# Patient Record
Sex: Female | Born: 1995 | Hispanic: Yes | Marital: Married | Smoking: Never smoker
Health system: Southern US, Community
[De-identification: ages and names within clinical notes are randomized; demographics above are authoritative.]

## PROBLEM LIST (undated history)

## (undated) ENCOUNTER — Inpatient Hospital Stay (HOSPITAL_COMMUNITY): Payer: Self-pay

## (undated) DIAGNOSIS — J45909 Unspecified asthma, uncomplicated: Secondary | ICD-10-CM

## (undated) DIAGNOSIS — O039 Complete or unspecified spontaneous abortion without complication: Secondary | ICD-10-CM

## (undated) DIAGNOSIS — R000 Tachycardia, unspecified: Secondary | ICD-10-CM

## (undated) DIAGNOSIS — E119 Type 2 diabetes mellitus without complications: Secondary | ICD-10-CM

## (undated) DIAGNOSIS — Z789 Other specified health status: Secondary | ICD-10-CM

## (undated) HISTORY — PX: NO PAST SURGERIES: SHX2092

## (undated) HISTORY — PX: APPENDECTOMY: SHX54

---

## 2017-11-06 ENCOUNTER — Encounter (HOSPITAL_COMMUNITY): Payer: Self-pay

## 2017-11-06 ENCOUNTER — Emergency Department (HOSPITAL_COMMUNITY): Payer: No Typology Code available for payment source

## 2017-11-06 ENCOUNTER — Other Ambulatory Visit: Payer: Self-pay

## 2017-11-06 ENCOUNTER — Emergency Department (HOSPITAL_COMMUNITY)
Admission: EM | Admit: 2017-11-06 | Discharge: 2017-11-06 | Disposition: A | Discharge status: Home / Self Care | Payer: No Typology Code available for payment source | Attending: Emergency Medicine | Admitting: Emergency Medicine

## 2017-11-06 DIAGNOSIS — O26851 Spotting complicating pregnancy, first trimester: Secondary | ICD-10-CM | POA: Insufficient documentation

## 2017-11-06 DIAGNOSIS — O9989 Other specified diseases and conditions complicating pregnancy, childbirth and the puerperium: Secondary | ICD-10-CM | POA: Insufficient documentation

## 2017-11-06 DIAGNOSIS — R1030 Lower abdominal pain, unspecified: Secondary | ICD-10-CM | POA: Insufficient documentation

## 2017-11-06 DIAGNOSIS — Z3A01 Less than 8 weeks gestation of pregnancy: Secondary | ICD-10-CM | POA: Insufficient documentation

## 2017-11-06 DIAGNOSIS — B379 Candidiasis, unspecified: Secondary | ICD-10-CM

## 2017-11-06 DIAGNOSIS — B9689 Other specified bacterial agents as the cause of diseases classified elsewhere: Secondary | ICD-10-CM

## 2017-11-06 DIAGNOSIS — N760 Acute vaginitis: Secondary | ICD-10-CM

## 2017-11-06 LAB — CBC WITH DIFFERENTIAL/PLATELET
BASOS PCT: 0 %
Basophils Absolute: 0 10*3/uL (ref 0.0–0.1)
Eosinophils Absolute: 0.1 10*3/uL (ref 0.0–0.7)
Eosinophils Relative: 1 %
HEMATOCRIT: 39.3 % (ref 36.0–46.0)
Hemoglobin: 12.8 g/dL (ref 12.0–15.0)
LYMPHS PCT: 33 %
Lymphs Abs: 3.1 10*3/uL (ref 0.7–4.0)
MCH: 27.9 pg (ref 26.0–34.0)
MCHC: 32.6 g/dL (ref 30.0–36.0)
MCV: 85.8 fL (ref 78.0–100.0)
MONO ABS: 0.5 10*3/uL (ref 0.1–1.0)
Monocytes Relative: 5 %
NEUTROS ABS: 5.7 10*3/uL (ref 1.7–7.7)
Neutrophils Relative %: 61 %
Platelets: 340 10*3/uL (ref 150–400)
RBC: 4.58 MIL/uL (ref 3.87–5.11)
RDW: 13.7 % (ref 11.5–15.5)
WBC: 9.3 10*3/uL (ref 4.0–10.5)

## 2017-11-06 LAB — URINALYSIS, ROUTINE W REFLEX MICROSCOPIC
Bilirubin Urine: NEGATIVE
Glucose, UA: NEGATIVE mg/dL
Hgb urine dipstick: NEGATIVE
Ketones, ur: NEGATIVE mg/dL
LEUKOCYTES UA: NEGATIVE
Nitrite: NEGATIVE
PH: 6 (ref 5.0–8.0)
Protein, ur: NEGATIVE mg/dL
Specific Gravity, Urine: 1.024 (ref 1.005–1.030)

## 2017-11-06 LAB — BASIC METABOLIC PANEL
ANION GAP: 9 (ref 5–15)
BUN: 7 mg/dL (ref 6–20)
CO2: 23 mmol/L (ref 22–32)
Calcium: 9.3 mg/dL (ref 8.9–10.3)
Chloride: 107 mmol/L (ref 98–111)
Creatinine, Ser: 0.74 mg/dL (ref 0.44–1.00)
GFR calc Af Amer: >60 mL/min (ref >60)
GFR calc non Af Amer: >60 mL/min (ref >60)
Glucose, Bld: 121 mg/dL — ABNORMAL HIGH (ref 70–99)
Potassium: 3.6 mmol/L (ref 3.5–5.1)
Sodium: 139 mmol/L (ref 135–145)

## 2017-11-06 LAB — TYPE AND SCREEN
ABO/RH(D): A POS
Antibody Screen: NEGATIVE

## 2017-11-06 LAB — WET PREP, GENITAL
Sperm: NONE SEEN
Trich, Wet Prep: NONE SEEN

## 2017-11-06 LAB — HCG, QUANTITATIVE, PREGNANCY: HCG, BETA CHAIN, QUANT, S: 29310 m[IU]/mL — AB (ref <5)

## 2017-11-06 LAB — ABO/RH: ABO/RH(D): A POS

## 2017-11-06 LAB — I-STAT BETA HCG BLOOD, ED (MC, WL, AP ONLY): I-stat hCG, quantitative: >2000 m[IU]/mL — ABNORMAL HIGH (ref <5)

## 2017-11-06 MED ORDER — CLOTRIMAZOLE 1 % VA CREA: 1.0000 | TOPICAL_CREAM | Freq: Every day | VAGINAL | 0 refills | Status: DC

## 2017-11-06 MED ORDER — PRENATAL COMPLETE 14-0.4 MG PO TABS: 1.0000 | ORAL_TABLET | Freq: Every day | ORAL | 0 refills | Status: DC

## 2017-11-06 MED ORDER — METRONIDAZOLE 0.75 % VA GEL: 1.0000 | Freq: Two times a day (BID) | VAGINAL | 0 refills | Status: DC

## 2017-11-06 NOTE — ED Provider Notes (Signed)
Physical Exam  BP 118/78   Pulse 90   Temp 98.5 F (36.9 C) (Oral)   Resp 16   Ht 5\' 3"  (1.6 m)   Wt 52.6 kg (116 lb)   LMP 10/18/2017 (Exact Date)   SpO2 100%   BMI 20.55 kg/m   [facility]  [facility], PA-C at 4:29 PM. Briefly, the patient is a 22 y.o. female with PMHx of  has no past medical history on file. here with vaginal spotting and lower abdominal discomfort.  Patient reports that she is in the first trimester of her first pregnancy, estimating approximately 6 weeks.  Patient reports that lower abdominal discomfort is intermittent and sharp, not present when I evaluate her.  Patient otherwise asymptomatic, and reports she had one episode of nausea almost 1 week ago, but feels it was more related to having a headache.  Labs Reviewed  WET PREP, GENITAL - Abnormal; Notable for the following components:      Result Value   Yeast Wet Prep HPF POC MANY (*)    Clue Cells Wet Prep HPF POC PRESENT (*)    WBC, Wet Prep HPF POC FEW (*)    All other components within normal limits  BASIC METABOLIC PANEL - Abnormal; Notable for the following components:   Glucose, Bld 121 (*)    All other components within normal limits  I-STAT BETA HCG BLOOD, ED ([facility], [facility], [facility] ONLY) - Abnormal; Notable for the following components:   I-stat hCG, quantitative >2,000.0 (*)    All other components within normal limits  CBC WITH DIFFERENTIAL/PLATELET  URINALYSIS, ROUTINE W REFLEX MICROSCOPIC  ABO/RH  TYPE AND SCREEN  GC/CHLAMYDIA PROBE AMP ([place]) NOT AT [facility]    Course of Care:   Physical Exam  Constitutional: She appears well-developed and well-nourished. No distress.  Sitting comfortably in bed.  HENT:  Head: Normocephalic and atraumatic.  Eyes: Conjunctivae are normal. Right eye exhibits no discharge. Left eye exhibits no discharge.  EOMs normal to gross examination.  Neck: Normal range of motion.  Cardiovascular: Normal rate and regular rhythm.  Intact, 2+ radial pulse.   Pulmonary/Chest:  Normal respiratory effort. Patient converses comfortably. No audible wheeze or stridor.  Abdominal: Soft. She exhibits no distension. There is no tenderness. There is no guarding.  Benign abdomen with no guarding or rebound.  Musculoskeletal: Normal range of motion.  Neurological: She is alert.  Cranial nerves intact to gross observation. Patient moves extremities without difficulty.  Skin: Skin is warm and dry. She is not diaphoretic.  Psychiatric: She has a normal mood and affect. Her behavior is normal. Judgment and thought content normal.  Nursing note and vitals reviewed.   ED Course/Procedures   Clinical Course as of Nov 07 1819  Sat Nov 06, 2017  1818 Reassessed.  Benign abdomen.  Discussed results with patient including requiring follow-up at [facility] in 48 hours for repeat hCG followed by repeat ultrasound in 7 to 10 days.  Encouraged to return for any increase in bleeding or sudden increase in abdominal pain.   [AM]    Clinical Course User Index [AM] Elisha PonderMurray, Takeia Ciaravino B, PA-C    Procedures  MDM  Plan to d/c after US if normal with topical metrogel, clotrimazole, and prenatal vitamin.  Discharge plan is to have patient follow-up [facility] in 48 hours for recheck, given that she had no fetal activity ultrasound, which may be normal given the early pregnancy.  Radiologist also recommends repeat ultrasound in 7 to  10 days.  Patient is in full understanding.  Return precautions given.  Counseled patient on early pregnancy prenatal care and proper medication usage.    Elisha Ponder, PA-C 11/06/17 Lauretta Chester    Shaune Pollack, MD 11/06/17 2351

## 2017-11-06 NOTE — Discharge Instructions (Addendum)
Follow[facility] for [facility] evaluation and to establish OB/GYN there. You will [facility] to [facility] in 48 hours for repeat check of your pregnancy hormone, followed by a repeat ultrasound in 7 to 10 days provided everything is progressing normally.  Please return to the emergency department if you develop any sudden increase in vaginal bleeding or abdominal pain.  Please take your prenatal vitamin daily beginning tonight.  You will take the intravaginal medications, clotrimazole twice daily, and metronidazole, twice a day for the next 7 days.  These are to treat yeast infection and overgrowth of normal bacteria in the vagina.

## 2017-11-06 NOTE — ED Triage Notes (Signed)
She tells me she is ~[redacted] weeks gestation, and is here with c/o lower abd. "cramping and bleeding like I'm about to start my period". She is in no distress.

## 2017-11-06 NOTE — ED Provider Notes (Cosign Needed)
[facility]-EMERGENCY DEPT Provider Note   CSN: [id number] Arrival date & time: 11/06/17  [id number]     History   Chief Complaint Chief Complaint  Patient presents with  . Vaginal Bleeding    HPI Wanda Erickson is a 22 y.o. female who presents to ED for evaluation of lower abdominal cramping for the past week and spotting that began today.  She believes that she is approximately [redacted] weeks pregnant.  This is her first pregnancy.  She has not yet established care with an OB/GYN due to insurance issues, so an ultrasound has not been completed.  She reports one episode of nonbloody, nonbilious emesis earlier in the week but reports daily nausea and decreased appetite.  Reports several episodes of diarrhea for the past week.  She does report some yellow vaginal discharge before onset of spotting today.  She denies any fever, dysuria, hematuria, hematochezia or melena, chest pain, shortness of breath.  HPI  No past medical history on file.  There are no active problems to display for this patient.      OB History   None      Home Medications    Prior to Admission medications   Medication Sig Start Date End Date Taking? Authorizing Provider  clotrimazole (GYNE-LOTRIMIN) 1 % vaginal cream Place 1 Applicatorful vaginally at bedtime. 11/06/17   Destaney Sarkis, PA-C  metroNIDAZOLE (METROGEL VAGINAL) 0.75 % vaginal gel Place 1 Applicatorful vaginally 2 (two) times daily. 11/06/17   Christle Nolting, Hillary BowHina, PA-C  Prenatal Vit-Fe Fumarate-FA (PRENATAL COMPLETE) 14-0.4 MG TABS Take 1 tablet by mouth daily. 11/06/17   Dietrich PatesKhatri, Olanna Percifield, PA-C    Family History No family history on file.  Social History Social History   Tobacco Use  . Smoking status: Never Smoker  . Smokeless tobacco: Never Used  Substance Use Topics  . Alcohol use: Not on file  . Drug use: Not on file     Allergies   Septra [sulfamethoxazole-trimethoprim]   Review of Systems Review of Systems  Constitutional:  Negative for appetite change, chills and fever.  HENT: Negative for ear pain, rhinorrhea, sneezing and sore throat.   Eyes: Negative for photophobia and visual disturbance.  Respiratory: Negative for cough, chest tightness, shortness of breath and wheezing.   Cardiovascular: Negative for chest pain and palpitations.  Gastrointestinal: Positive for abdominal pain, diarrhea and nausea. Negative for blood in stool, constipation and vomiting.  Genitourinary: Positive for vaginal bleeding and vaginal discharge. Negative for dysuria, flank pain, hematuria and urgency.  Musculoskeletal: Negative for myalgias.  Skin: Negative for rash.  Neurological: Negative for dizziness, weakness and light-headedness.     Physical Exam Updated Vital Signs BP 118/78   Pulse 90   Temp 98.5 F (36.9 C) (Oral)   Resp 16   Ht 5\' 3"  (1.6 m)   Wt 52.6 kg (116 lb)   LMP 10/18/2017 (Exact Date)   SpO2 100%   BMI 20.55 kg/m   Physical Exam  Constitutional: She appears well-developed and well-nourished. No distress.  Anxious. NAD.  HENT:  Head: Normocephalic and atraumatic.  Nose: Nose normal.  Eyes: Conjunctivae and EOM are normal. Left eye exhibits no discharge. No scleral icterus.  Neck: Normal range of motion. Neck supple.  Cardiovascular: Normal rate, regular rhythm, normal heart sounds and intact distal pulses. Exam reveals no gallop and no friction rub.  No murmur heard. Pulmonary/Chest: Effort normal and breath sounds normal. No respiratory distress.  Abdominal: Soft. Bowel sounds are normal. She exhibits no distension.  There is no tenderness. There is no guarding.  Genitourinary: Cervix exhibits no motion tenderness. Right adnexum displays no tenderness. Left adnexum displays no tenderness. There is bleeding in the vagina.  Genitourinary Comments: Pelvic exam: normal external genitalia without evidence of trauma. VULVA: normal appearing vulva with no masses, tenderness or lesion. VAGINA: normal  appearing vagina with normal color and discharge, scant, dark red blood in vaginal vault; no lesions. CERVIX: normal appearing cervix without lesions, cervical motion tenderness absent, cervical os closed with out purulent discharge; No vaginal discharge. Wet prep and DNA probe for chlamydia and GC obtained.   ADNEXA: normal adnexa in size, nontender and no masses UTERUS: uterus is normal size, shape, consistency and nontender.    Musculoskeletal: Normal range of motion. She exhibits no edema.  Neurological: She is alert. She exhibits normal muscle tone. Coordination normal.  Skin: Skin is warm and dry. No rash noted.  Psychiatric: She has a normal mood and affect.  Nursing note and vitals reviewed.    ED Treatments / Results  Labs (all labs ordered are listed, but only abnormal results are displayed) Labs Reviewed  WET PREP, GENITAL - Abnormal; Notable for the following components:      Result Value   Yeast Wet Prep HPF POC MANY (*)    Clue Cells Wet Prep HPF POC PRESENT (*)    WBC, Wet Prep HPF POC FEW (*)    All other components within normal limits  BASIC METABOLIC PANEL - Abnormal; Notable for the following components:   Glucose, Bld 121 (*)    All other components within normal limits  I-STAT BETA HCG BLOOD, ED ([facility], [facility], [facility] ONLY) - Abnormal; Notable for the following components:   I-stat hCG, quantitative >2,000.0 (*)    All other components within normal limits  CBC WITH DIFFERENTIAL/PLATELET  URINALYSIS, ROUTINE W REFLEX MICROSCOPIC  HCG, QUANTITATIVE, PREGNANCY  ABO/RH  TYPE AND SCREEN  GC/CHLAMYDIA PROBE AMP ([place]) NOT AT [facility]    EKG None  Radiology No results found.  Procedures Procedures (including critical care time)  Medications Ordered in ED Medications - No data to display   Initial Impression / Assessment and Plan / ED Course  I have reviewed the triage vital signs and the nursing notes.  Pertinent labs & imaging results that were available  during my care of the patient were reviewed by me and considered in my medical decision making (see chart for details).     21 year old female presents to ED for evaluation of lower abdominal cramping for the past week, vaginal spotting that began today.  She believes that she is approximately [redacted] weeks pregnant.  This is her first pregnancy.  Had not yet established care with an OB/GYN due to insurance issues.  Reports one episode of nonbloody, nonbilious emesis earlier in the week but reports daily nausea and decreased appetite.  She reports some yellow vaginal discharge before onset of spotting.  Denies any fever, dysuria, chest pain or shortness of breath.  On physical exam she is overall well-appearing.  She does appear anxious.  There is no abdominal tenderness to palpation.  Pelvic exam revealed scant dark red blood in vaginal vault.  No cervical motion tenderness or adnexal tenderness.  Lab work significant for hCG greater than 2000.  CBC, BMP, urinalysis all unremarkable.  Wet prep positive for yeast and clue cells.  GC chlamydia probe pending.  Pelvic ultrasound pending.  Will treat with MetroGel and clotrimazole vaginal for BV and yeast infection.  Care handed off to oncoming provider, ADayton Scrape pending ultrasound. Discharge home if normal.  Portions of this note were generated with Dragon dictation software. Dictation errors may occur despite best attempts at proofreading.   Final Clinical Impressions(s) / ED Diagnoses   Final diagnoses:  Bacterial vaginosis  Yeast infection    ED Discharge Orders        Ordered    metroNIDAZOLE (METROGEL VAGINAL) 0.75 % vaginal gel  2 times daily     11/06/17 1643    clotrimazole (GYNE-LOTRIMIN) 1 % vaginal cream  Daily at bedtime     11/06/17 1643    Prenatal Vit-Fe Fumarate-FA (PRENATAL COMPLETE) 14-0.4 MG TABS  Daily     11/06/17 1643       Dietrich Pates, PA-C 11/06/17 1644

## 2017-11-08 ENCOUNTER — Inpatient Hospital Stay (HOSPITAL_COMMUNITY)
Admission: AD | Admit: 2017-11-08 | Discharge: 2017-11-08 | Disposition: A | Discharge status: Home / Self Care | Payer: No Typology Code available for payment source | Source: Ambulatory Visit | Attending: Obstetrics & Gynecology | Admitting: Obstetrics & Gynecology

## 2017-11-08 DIAGNOSIS — Z3490 Encounter for supervision of normal pregnancy, unspecified, unspecified trimester: Secondary | ICD-10-CM

## 2017-11-08 LAB — GC/CHLAMYDIA PROBE AMP (~~LOC~~) NOT AT ARMC
Chlamydia: NEGATIVE
Neisseria Gonorrhea: NEGATIVE

## 2017-11-08 NOTE — Discharge Instructions (Signed)
First Trimester of Pregnancy The first trimester of pregnancy is from week 1 until the end of week 13 (months 1 through 3). During this time, your baby will begin to develop inside you. At 6-8 weeks, the eyes and face are formed, and the heartbeat can be seen on ultrasound. At the end of 12 weeks, all the baby's organs are formed. Prenatal care is all the medical care you receive before the birth of your baby. Make sure you get good prenatal care and follow all of your doctor's instructions. Follow these instructions at home: Medicines  Take over-the-counter and prescription medicines only as told by your doctor. Some medicines are safe and some medicines are not safe during pregnancy.  Take a prenatal vitamin that contains at least 600 micrograms (mcg) of folic acid.  If you have trouble pooping (constipation), take medicine that will make your stool soft (stool softener) if your doctor approves. Eating and drinking  Eat regular, healthy meals.  Your doctor will tell you the amount of weight gain that is right for you.  Avoid raw meat and uncooked cheese.  If you feel sick to your stomach (nauseous) or throw up (vomit): ? Eat 4 or 5 small meals a day instead of 3 large meals. ? Try eating a few soda crackers. ? Drink liquids between meals instead of during meals.  To prevent constipation: ? Eat foods that are high in fiber, like fresh fruits and vegetables, whole grains, and beans. ? Drink enough fluids to keep your pee (urine) clear or pale yellow. Activity  Exercise only as told by your doctor. Stop exercising if you have cramps or pain in your lower belly (abdomen) or low back.  Do not exercise if it is too hot, too humid, or if you are in a place of great height (high altitude).  Try to avoid standing for long periods of time. Move your legs often if you must stand in one place for a long time.  Avoid heavy lifting.  Wear low-heeled shoes. Sit and stand up straight.  You  can have sex unless your doctor tells you not to. Relieving pain and discomfort  Wear a good support bra if your breasts are sore.  Take warm water baths (sitz baths) to soothe pain or discomfort caused by hemorrhoids. Use hemorrhoid cream if your doctor says it is okay.  Rest with your legs raised if you have leg cramps or low back pain.  If you have puffy, bulging veins (varicose veins) in your legs: ? Wear support hose or compression stockings as told by your doctor. ? Raise (elevate) your feet for 15 minutes, 3-4 times a day. ? Limit salt in your food. Prenatal care  Schedule your prenatal visits by the twelfth week of pregnancy.  Write down your questions. Take them to your prenatal visits.  Keep all your prenatal visits as told by your doctor. This is important. Safety  Wear your seat belt at all times when driving.  Make a list of emergency phone numbers. The list should include numbers for family, friends, the hospital, and police and fire departments. General instructions  Ask your doctor for a referral to a local prenatal class. Begin classes no later than at the start of month 6 of your pregnancy.  Ask for help if you need counseling or if you need help with nutrition. Your doctor can give you advice or tell you where to go for help.  Do not use hot tubs, steam rooms, or   saunas.  Do not douche or use tampons or scented sanitary pads.  Do not cross your legs for long periods of time.  Avoid all herbs and alcohol. Avoid drugs that are not approved by your doctor.  Do not use any tobacco products, including cigarettes, chewing tobacco, and electronic cigarettes. If you need help quitting, ask your doctor. You may get counseling or other support to help you quit.  Avoid cat litter boxes and soil used by cats. These carry germs that can cause birth defects in the baby and can cause a loss of your baby (miscarriage) or stillbirth.  Visit your dentist. At home, brush  your teeth with a soft toothbrush. Be gentle when you floss. Contact a doctor if:  You are dizzy.  You have mild cramps or pressure in your lower belly.  You have a nagging pain in your belly area.  You continue to feel sick to your stomach, you throw up, or you have watery poop (diarrhea).  You have a bad smelling fluid coming from your vagina.  You have pain when you pee (urinate).  You have increased puffiness (swelling) in your face, hands, legs, or ankles. Get help right away if:  You have a fever.  You are leaking fluid from your vagina.  You have spotting or bleeding from your vagina.  You have very bad belly cramping or pain.  You gain or lose weight rapidly.  You throw up blood. It may look like coffee grounds.  You are around people who have German measles, fifth disease, or chickenpox.  You have a very bad headache.  You have shortness of breath.  You have any kind of trauma, such as from a fall or a car accident. Summary  The first trimester of pregnancy is from week 1 until the end of week 13 (months 1 through 3).  To take care of yourself and your unborn baby, you will need to eat healthy meals, take medicines only if your doctor tells you to do so, and do activities that are safe for you and your baby.  Keep all follow-up visits as told by your doctor. This is important as your doctor will have to ensure that your baby is healthy and growing well. This information is not intended to replace advice given to you by your health care provider. Make sure you discuss any questions you have with your health care provider. Document Released: 09/09/2007 Document Revised: 03/31/2016 Document Reviewed: 03/31/2016 Elsevier Interactive Patient Education  2017 [employer]  

## 2017-11-08 NOTE — [facility] Provider Note (Signed)
[name] is a 22 y.o. female who [facility] for abdominal pain in early pregnancy. The patient was instructed to come to [facility] for a follow-up hCG today. The patient denies pain or bleeding now. US showed SIUP at [id number] without FHR noted. Quant hCG is not indicated at this time. Patient informed of new plan of care to follow-up with US in 1 week. US ordered. They will call her with an appointment. First trimester precautions discussed.   Marny LowensteinWenzel, Julie N, PA-C 11/08/2017 1:30 PM

## 2017-11-09 ENCOUNTER — Inpatient Hospital Stay (HOSPITAL_COMMUNITY): Payer: No Typology Code available for payment source

## 2017-11-09 ENCOUNTER — Other Ambulatory Visit: Payer: Self-pay

## 2017-11-09 ENCOUNTER — Inpatient Hospital Stay (HOSPITAL_COMMUNITY)
Admission: AD | Admit: 2017-11-09 | Discharge: 2017-11-09 | Disposition: A | Discharge status: Home / Self Care | Payer: No Typology Code available for payment source | Source: Ambulatory Visit | Attending: Obstetrics & Gynecology | Admitting: Obstetrics & Gynecology

## 2017-11-09 ENCOUNTER — Encounter (HOSPITAL_COMMUNITY): Payer: Self-pay

## 2017-11-09 DIAGNOSIS — O208 Other hemorrhage in early pregnancy: Secondary | ICD-10-CM | POA: Insufficient documentation

## 2017-11-09 DIAGNOSIS — O468X1 Other antepartum hemorrhage, first trimester: Secondary | ICD-10-CM

## 2017-11-09 DIAGNOSIS — O26891 Other specified pregnancy related conditions, first trimester: Secondary | ICD-10-CM | POA: Diagnosis present

## 2017-11-09 DIAGNOSIS — Z3A01 Less than 8 weeks gestation of pregnancy: Secondary | ICD-10-CM | POA: Diagnosis not present

## 2017-11-09 DIAGNOSIS — O418X10 Other specified disorders of amniotic fluid and membranes, first trimester, not applicable or unspecified: Secondary | ICD-10-CM

## 2017-11-09 DIAGNOSIS — O26851 Spotting complicating pregnancy, first trimester: Secondary | ICD-10-CM | POA: Diagnosis not present

## 2017-11-09 DIAGNOSIS — R109 Unspecified abdominal pain: Secondary | ICD-10-CM | POA: Diagnosis present

## 2017-11-09 DIAGNOSIS — O30031 Twin pregnancy, monochorionic/diamniotic, first trimester: Secondary | ICD-10-CM

## 2017-11-09 LAB — URINALYSIS, ROUTINE W REFLEX MICROSCOPIC
BILIRUBIN URINE: NEGATIVE
Glucose, UA: NEGATIVE mg/dL
Hgb urine dipstick: NEGATIVE
KETONES UR: NEGATIVE mg/dL
Leukocytes, UA: NEGATIVE
NITRITE: NEGATIVE
Protein, ur: NEGATIVE mg/dL
Specific Gravity, Urine: 1.031 — ABNORMAL HIGH (ref 1.005–1.030)
pH: 6 (ref 5.0–8.0)

## 2017-11-09 LAB — POCT PREGNANCY, URINE: PREG TEST UR: POSITIVE — AB

## 2017-11-09 NOTE — Discharge Instructions (Signed)
Multiple Pregnancy Having a multiple pregnancy means that a woman is carrying more than one baby at a time. She may be pregnant with twins, triplets, or more. The majority of multiple pregnancies are twins. Naturally conceiving triplets or more (higher-order multiples) is rare. Multiple pregnancies are riskier than single pregnancies. A woman with a multiple pregnancy is more likely to have certain problems during her pregnancy. Therefore, she will need to have more frequent appointments for prenatal care. How does a multiple pregnancy happen? A multiple pregnancy happens when:  The woman's body releases more than one egg at a time, and then each egg gets fertilized by a different sperm. ? This is the most common type of multiple pregnancy. ? Twins or other multiples produced this way are fraternal. They are no more alike than non-multiple siblings are.  One sperm fertilizes one egg, which then divides into more than one embryo. ? Twins or other multiples produced this way are identical. Identical multiples are always the same gender, and they look very much alike.  Who is most likely to have a multiple pregnancy? A multiple pregnancy is more likely to develop in women who:  Have had fertility treatment, especially if the treatment included fertility drugs.  Are older than 23 years of age.  Have already had four or more children.  Have a family history of multiple pregnancy.  How is a multiple pregnancy diagnosed? A multiple pregnancy may be diagnosed based on:  Symptoms such as: ? Rapid weight gain in the first 3 months of pregnancy (first trimester). ? More severe nausea and breast tenderness than what is typical of a single pregnancy. ? The uterus measuring larger than what is normal for the stage of the pregnancy.  Blood tests that detect a higher-than-normal level of human chorionic gonadotropin (hCG). This is a hormone that your body produces in early pregnancy.  Ultrasound  exam. This is used to confirm that you are carrying multiples.  What risks are associated with multiple pregnancy? A multiple pregnancy puts you at a higher risk for certain problems during or after your pregnancy, including:  Having your babies delivered before you have reached a full-term pregnancy (preterm birth). A full-term pregnancy lasts for at least 37 weeks. Babies born before 37 weeks may have a higher risk of a variety of health problems, such as breathing problems, feeding difficulties, cerebral palsy, and learning disabilities.  Diabetes.  Preeclampsia. This is a serious condition that causes high blood pressure along with other symptoms, such as swelling and headaches, during pregnancy.  Excessive blood loss after childbirth (postpartum hemorrhage).  Postpartum depression.  Low birth weight of the babies.  How will having a multiple pregnancy affect my care? Your health care provider will want to monitor you more closely during your pregnancy to make sure that your babies are growing normally and that you are healthy. Follow these instructions at home: Because your pregnancy is considered to be high risk, you will need to work closely with your health care team. You may also need to make some lifestyle changes. These may include the following: Eating and drinking  Increase your nutrition. ? Follow your health care provider's recommendations for weight gain. You may need to gain a little extra weight when you are pregnant with multiples. ? Eat healthy snacks often throughout the day. This can add calories and reduce nausea.  Drink enough fluid to keep your urine clear or pale yellow.  Take prenatal vitamins. Activity By 20-24 weeks, you may   need to limit your activities.  Avoid activities and work that take a lot of effort (are strenuous).  Ask your health care provider when you should stop having sexual intercourse.  Rest often.  General instructions  Do not use  any products that contain nicotine or tobacco, such as cigarettes and e-cigarettes. If you need help quitting, ask your health care provider.  Do not drink alcohol or use illegal drugs.  Take over-the-counter and prescription medicines only as told by your health care provider.  Arrange for extra help around the house.  Keep all follow-up visits and all prenatal visits as told by your health care provider. This is important. Contact a health care provider if:  You have dizziness.  You have persistent nausea, vomiting, or diarrhea.  You are having trouble gaining weight.  You have feelings of depression or other emotions that are interfering with your normal activities. Get help right away if:  You have a fever.  You have pain with urination.  You have fluid leaking from your vagina.  You have a bad-smelling vaginal discharge.  You notice increased swelling in your face, hands, legs, or ankles.  You have spotting or bleeding from your vagina.  You have pelvic cramps, pelvic pressure, or nagging pain in your abdomen or lower back.  You are having regular contractions.  You develop a severe headache, with or without visual changes.  You have shortness of breath or chest pain.  You notice less fetal movement, or no fetal movement. This information is not intended to replace advice given to you by your health care provider. Make sure you discuss any questions you have with your health care provider. Document Released: 12/31/2007 Document Revised: 11/22/2015 Document Reviewed: 11/22/2015 Elsevier Interactive Patient Education  2018 [employer]vier Inc.  Pelvic Rest Pelvic rest may be recommended if:  Your placenta is partially or completely covering the opening of your cervix (placenta previa).  There is bleeding between the wall of the uterus and the amniotic sac in the first trimester of pregnancy (subchorionic hemorrhage).  You went into labor too early (preterm  labor).  Based on your overall health and the health of your baby, your health care provider will decide if pelvic rest is right for you. How do I rest my pelvis? For as long as told by your health care provider:  Do not have sex, sexual stimulation, or an orgasm.  Do not use tampons. Do not douche. Do not put anything in your vagina.  Do not lift anything that is heavier than 10 lb (4.5 kg).  Avoid activities that take a lot of effort (are strenuous).  Avoid any activity in which your pelvic muscles could become strained.  When should I seek medical care? Seek medical care if you have:  Cramping pain in your lower abdomen.  Vaginal discharge.  A low, dull backache.  Regular contractions.  Uterine tightening.  When should I seek immediate medical care? Seek immediate medical care if:  You have vaginal bleeding and you are pregnant.  This information is not intended to replace advice given to you by your health care provider. Make sure you discuss any questions you have with your health care provider. Document Released: 07/18/2010 Document Revised: 08/29/2015 Document Reviewed: 09/24/2014 Elsevier Interactive Patient Education  2018 Elsevier Inc.  Subchorionic Hematoma A subchorionic hematoma is a gathering of blood between the outer wall of the placenta and the inner wall of the womb (uterus). The placenta is the organ that connects the  fetus to the wall of the uterus. The placenta performs the feeding, breathing (oxygen to the fetus), and waste removal (excretory work) of the fetus. Subchorionic hematoma is the most common abnormality found on a result from ultrasonography done during the first trimester or early second trimester of pregnancy. If there has been little or no vaginal bleeding, early small hematomas usually shrink on their own and do not affect your baby or pregnancy. The blood is gradually absorbed over 1-2 weeks. When bleeding starts later in pregnancy or the  hematoma is larger or occurs in an older pregnant woman, the outcome may not be as good. Larger hematomas may get bigger, which increases the chances for miscarriage. Subchorionic hematoma also increases the risk of premature detachment of the placenta from the uterus, preterm (premature) labor, and stillbirth. Follow these instructions at home:  Stay on bed rest if your health care provider recommends this. Although bed rest will not prevent more bleeding or prevent a miscarriage, your health care provider may recommend bed rest until you are advised otherwise.  Avoid heavy lifting (more than 10 lb [4.5 kg]), exercise, sexual intercourse, or douching as directed by your health care provider.  Keep track of the number of pads you use each day and how soaked (saturated) they are. Write down this information.  Do not use tampons.  Keep all follow-up appointments as directed by your health care provider. Your health care provider may ask you to have follow-up blood tests or ultrasound tests or both. Get help right away if:  You have severe cramps in your stomach, back, abdomen, or pelvis.  You have a fever.  You pass large clots or tissue. Save any tissue for your health care provider to look at.  Your bleeding increases or you become lightheaded, feel weak, or have fainting episodes. This information is not intended to replace advice given to you by your health care provider. Make sure you discuss any questions you have with your health care provider. Document Released: 07/08/2006 Document Revised: 08/29/2015 Document Reviewed: 10/20/2012 Elsevier Interactive Patient Education  2017 [employer]

## 2017-11-09 NOTE — MAU Provider Note (Signed)
History     CSN: [id number]  Arrival date and time: 11/09/17 1408   First Provider Initiated Contact with Patient 11/09/17 1454      Chief Complaint  Patient presents with  . Vaginal Bleeding  . Abdominal Pain   HPI Ms. Wanda Erickson is a 22 y.o. G1P0 at 7615w2d who presents to MAU today with complaint of spotting. [facility] with new onset spotting. She states that it stopped soon after that visit and returned today. She was also diagnosed with a yeast infection and BV at that time and is currently using clotrimazol and Metrogel for treatment of those infections. She states mild suprapubic discomfort. She has not used any pain medications. She denies fever, UTI symptoms, N/V/D but has had some constipation, last BM was 3 days ago.   OB History    Gravida  1   Para      Term      Preterm      AB      Living        SAB      TAB      Ectopic      Multiple      Live Births              Past Medical History:  Diagnosis Date  . Medical history non-contributory     Past Surgical History:  Procedure Laterality Date  . NO PAST SURGERIES      History reviewed. No pertinent family history.  Social History   Tobacco Use  . Smoking status: Never Smoker  . Smokeless tobacco: Never Used  Substance Use Topics  . Alcohol use: Not Currently  . Drug use: Never    Allergies:  Allergies  Allergen Reactions  . Septra [Sulfamethoxazole-Trimethoprim]     No medications prior to admission.    Review of Systems  Constitutional: Negative for fever.  Gastrointestinal: Positive for abdominal pain and constipation. Negative for diarrhea, nausea and vomiting.  Genitourinary: Positive for vaginal bleeding. Negative for dysuria, frequency, urgency and vaginal discharge.   Physical Exam   Blood pressure 134/68, pulse 95, temperature 98.8 F (37.1 C), temperature source Oral, resp. rate 18, height 5\' 3"  (1.6 m), weight 163 lb (73.9 kg),  last menstrual period 09/19/2017, SpO2 100 %.  Physical Exam  Nursing note and vitals reviewed. Constitutional: She is oriented to person, place, and time. She appears well-developed and well-nourished. No distress.  HENT:  Head: Normocephalic and atraumatic.  Cardiovascular: Normal rate.  Respiratory: Effort normal.  GI: Soft. She exhibits no distension and no mass. There is no tenderness. There is no rebound and no guarding.  Neurological: She is alert and oriented to person, place, and time.  Skin: Skin is warm and dry. No erythema.  Psychiatric: She has a normal mood and affect.     Results for orders placed or performed during the hospital encounter of 11/09/17 (from the past 24 hour(s))  Urinalysis, Routine w reflex microscopic     Status: Abnormal   Collection Time: 11/09/17  2:36 PM  Result Value Ref Range   Color, Urine AMBER (A) YELLOW   APPearance CLOUDY (A) CLEAR   Specific Gravity, Urine 1.031 (H) 1.005 - 1.030   pH 6.0 5.0 - 8.0   Glucose, UA NEGATIVE NEGATIVE mg/dL   Hgb urine dipstick NEGATIVE NEGATIVE   Bilirubin Urine NEGATIVE NEGATIVE   Ketones, ur NEGATIVE NEGATIVE mg/dL   Protein, ur NEGATIVE NEGATIVE mg/dL  Nitrite NEGATIVE NEGATIVE   Leukocytes, UA NEGATIVE NEGATIVE  Pregnancy, urine POC     Status: Abnormal   Collection Time: 11/09/17  2:39 PM  Result Value Ref Range   Preg Test, Ur POSITIVE (A) NEGATIVE   US Ob Comp < 14 Wks  Result Date: 11/06/2017 CLINICAL DATA:  Pregnant, spotting EXAM: OBSTETRIC <14 WK [place] AND TRANSVAGINAL OB US TECHNIQUE: Both transabdominal and transvaginal ultrasound examinations were performed for complete evaluation of the gestation as well as the maternal uterus, adnexal regions, and pelvic cul-de-sac. Transvaginal technique was performed to assess early pregnancy. COMPARISON:  None. FINDINGS: Intrauterine gestational sac: Single Yolk sac:  Visualized. Embryo:  Visualized. Cardiac Activity: Not Visualized. CRL:  3.4 mm   6 w   0  d                  [place] EDC: 07/02/2018 Subchorionic hemorrhage:  None visualized. Maternal uterus/adnexae: Bilateral ovaries are within normal limits, noting a 2.4 cm simple right paraovarian cyst, benign. No free fluid. IMPRESSION: Single intrauterine gestational sac with yolk sac and small fetal pole, measuring 6 weeks 0 days by crown-rump length. No cardiac activity is visualized, although this may simply be due to technical limitations and early gestation. Serial beta HCG is suggested, supplemented by short-term follow-up pelvic ultrasound in 7-10 days as clinically warranted. Electronically Signed   By: Charline Bills M.D.   On: 11/06/2017 17:15   US Ob Transvaginal  Result Date: 11/09/2017 CLINICAL DATA:  Bleeding for 4 days. Quantitative beta HCG on 11/06/2017 was 11914. LMP was 09/19/2017. EDC by LMP is 06/26/2018. Gestational age by LMP is 7 weeks 2 days. EXAM: TRANSVAGINAL OB ULTRASOUND TECHNIQUE: Transvaginal ultrasound was performed for complete evaluation of the gestation as well as the maternal uterus, adnexal regions, and pelvic cul-de-sac. COMPARISON:  None. FINDINGS: Intrauterine gestational sac: Single Yolk sac:  2 yolk sacs are identified. Embryo:  Not Visualized. Cardiac Activity: Not Visualized. Heart Rate: Absent bpm MSD: 15.5 mm   6 w   2 d Subchorionic hemorrhage: Small subchorionic hemorrhage identified. The ovaries are normal in appearance. RIGHT corpus luteum cyst is present. Maternal uterus/adnexae: A para ovarian cyst is identified adjacent to the RIGHT ovary. Question of arcuate versus septate uterus. No free pelvic fluid. IMPRESSION: 1. Single intrauterine gestational sac. 2. There are 2 yolk sacs, consistent with diamniotic monochorionic twin gestation. 3. Given the lack of detectable embryos at this time, follow-up ultrasound is recommended in 14 days to assess dating. Electronically Signed   By: Norva Pavlov M.D.   On: 11/09/2017 16:20   US Ob Transvaginal  Result Date:  11/06/2017 CLINICAL DATA:  Pregnant, spotting EXAM: OBSTETRIC <14 WK [place] AND TRANSVAGINAL OB US TECHNIQUE: Both transabdominal and transvaginal ultrasound examinations were performed for complete evaluation of the gestation as well as the maternal uterus, adnexal regions, and pelvic cul-de-sac. Transvaginal technique was performed to assess early pregnancy. COMPARISON:  None. FINDINGS: Intrauterine gestational sac: Single Yolk sac:  Visualized. Embryo:  Visualized. Cardiac Activity: Not Visualized. CRL:  3.4 mm   6 w   0 d                  [place] EDC: 07/02/2018 Subchorionic hemorrhage:  None visualized. Maternal uterus/adnexae: Bilateral ovaries are within normal limits, noting a 2.4 cm simple right paraovarian cyst, benign. No free fluid. IMPRESSION: Single intrauterine gestational sac with yolk sac and small fetal pole, measuring 6 weeks 0 days by crown-rump length. No cardiac activity  is visualized, although this may simply be due to technical limitations and early gestation. Serial beta HCG is suggested, supplemented by short-term follow-up pelvic ultrasound in 7-10 days as clinically warranted. Electronically Signed   By: Charline Bills M.D.   On: 11/06/2017 17:15    MAU Course  Procedures Pt informed that the ultrasound is considered a limited OB ultrasound and is not intended to be a complete ultrasound exam.  Patient also informed that the ultrasound is not being completed with the intent of assessing for fetal or placental anomalies or any pelvic abnormalities.  Explained that the purpose of today's ultrasound is to assess for  viability.  Patient acknowledges the purpose of the exam and the limitations of the study. Gestation too small to visualize.   MDM Reviewed visit from [facility] on 11/06/17 including labs and [place].  US performed at bedside.  SIUP to small to visualize well. Will order formal US to confirm viablity.  US OB TV ordered - consistent with early gestation of mono-di twins. No embryo or FHR  seen.   Assessment and Plan  A: Twin gestation  Small subchorionic hemorrhage   P: Discharge home Complete previously prescribed clotrimazol and metrogel Advised to start taking an additional 1 mg Folic Acid with PNV First precautions and pelvic rest discussed Outpatient [place] ordered to confirm viability in ~ 10 days. They will call patient with an appointment.  Patient referred to [facility] for high risk OB prenatal care. In-basket message sent to schedule. They will call patient with an appointment  Patient may return to MAU as needed or if her condition were to change or worsen  Vonzella Nipple, PA-C 11/09/2017, 4:43 PM

## 2017-11-09 NOTE — MAU Note (Signed)
Pt. Had pos. hpt 4 weeks ago.  Pt reports brown bleeding on Sat and [facility]  Pt. Reports bleeding stopped but started again today.  Pt. Reports bright red spotting today.  Last intercourse was 2 months ago

## 2017-11-16 ENCOUNTER — Other Ambulatory Visit: Payer: Self-pay

## 2017-11-16 ENCOUNTER — Encounter (HOSPITAL_COMMUNITY): Payer: Self-pay

## 2017-11-16 ENCOUNTER — Inpatient Hospital Stay (HOSPITAL_COMMUNITY)
Admission: AD | Admit: 2017-11-16 | Discharge: 2017-11-16 | Disposition: A | Discharge status: Home / Self Care | Payer: No Typology Code available for payment source | Source: Ambulatory Visit | Attending: Obstetrics and Gynecology | Admitting: Obstetrics and Gynecology

## 2017-11-16 DIAGNOSIS — O209 Hemorrhage in early pregnancy, unspecified: Secondary | ICD-10-CM | POA: Diagnosis not present

## 2017-11-16 DIAGNOSIS — Z3A08 8 weeks gestation of pregnancy: Secondary | ICD-10-CM | POA: Insufficient documentation

## 2017-11-16 DIAGNOSIS — O30011 Twin pregnancy, monochorionic/monoamniotic, first trimester: Secondary | ICD-10-CM | POA: Insufficient documentation

## 2017-11-16 DIAGNOSIS — N939 Abnormal uterine and vaginal bleeding, unspecified: Secondary | ICD-10-CM | POA: Diagnosis present

## 2017-11-16 DIAGNOSIS — Z882 Allergy status to sulfonamides status: Secondary | ICD-10-CM | POA: Diagnosis not present

## 2017-11-16 LAB — URINALYSIS, ROUTINE W REFLEX MICROSCOPIC
Bacteria, UA: NONE SEEN
Bilirubin Urine: NEGATIVE
GLUCOSE, UA: NEGATIVE mg/dL
KETONES UR: NEGATIVE mg/dL
Leukocytes, UA: NEGATIVE
Nitrite: NEGATIVE
Protein, ur: NEGATIVE mg/dL
Specific Gravity, Urine: 1.018 (ref 1.005–1.030)
pH: 8 (ref 5.0–8.0)

## 2017-11-16 NOTE — MAU Provider Note (Signed)
[facility] Provider Note   CSN: [id number] Arrival date & time: 11/16/17  1122     History   Chief Complaint Chief Complaint  Patient presents with  . Vaginal Bleeding    HPI Wanda Erickson is a 22 y.o. G1P0 @ 4554w2d gestation by LMP presents to the MAU with vaginal bleeding. [facility] and in the MAU 11/09/17. Patient had ultrasound that showed early gestation of mono-di twins. No embryo or FHR seen at that time. Patient has f/u ultrasound scheduled. Patient reports that yesterday she went for a scheduled free [facility] and they told her she was having a miscarriage because they could not see a heart beat. Patent reports that it scared her and that is why she came today.   HPI  Past Medical History:  Diagnosis Date  . Medical history non-contributory     There are no active problems to display for this patient.   Past Surgical History:  Procedure Laterality Date  . NO PAST SURGERIES       OB History    Gravida  1   Para      Term      Preterm      AB      Living        SAB      TAB      Ectopic      Multiple      Live Births               Home Medications    Prior to Admission medications   Medication Sig Start Date End Date Taking? Authorizing Provider  clotrimazole (GYNE-LOTRIMIN) 1 % vaginal cream Place 1 Applicatorful vaginally at bedtime. 11/06/17   Khatri, Hina, PA-C  metroNIDAZOLE (METROGEL VAGINAL) 0.75 % vaginal gel Place 1 Applicatorful vaginally 2 (two) times daily. 11/06/17   Khatri, Hillary BowHina, PA-C  Prenatal Vit-Fe Fumarate-FA (PRENATAL COMPLETE) 14-0.4 MG TABS Take 1 tablet by mouth daily. 11/06/17   Dietrich PatesKhatri, Hina, PA-C    Family History History reviewed. No pertinent family history.  Social History Social History   Tobacco Use  . Smoking status: Never Smoker  . Smokeless tobacco: Never Used  Substance Use Topics  . Alcohol use:  Not Currently  . Drug use: Never     Allergies   Septra [sulfamethoxazole-trimethoprim]   Review of Systems Review of Systems  Genitourinary: Positive for vaginal bleeding.       Early pregnant  All other systems reviewed and are negative.    Physical Exam Updated Vital Signs BP 130/76 (BP Location: Right Arm)   Pulse (!) 117   Temp 98.3 F (36.8 C) (Oral)   Resp 18   LMP 09/19/2017 (Exact Date)   Physical Exam  Constitutional: She appears well-developed and well-nourished. No distress.  HENT:  Head: Normocephalic.  Eyes: EOM are normal.  Neck: Neck supple.  Cardiovascular: Normal rate.  Pulmonary/Chest: Effort normal.  Abdominal: Soft. There is no tenderness.  Genitourinary:  Genitourinary Comments: External genitalia without lesions, scant blood vaginal vault, cervix long, closed, no CMT, no adnexal tenderness or mass palpated, uterus slightly enlarged.   Musculoskeletal: Normal range of motion.  Neurological: She is alert.  Skin: Skin is warm and dry.  Psychiatric: She has a normal mood and affect. Her behavior is normal.  Nursing note and vitals reviewed.    ED Treatments / Results  Labs (all labs ordered are  listed, but only abnormal results are displayed) Labs Reviewed  URINALYSIS, ROUTINE W REFLEX MICROSCOPIC    Radiology No results found.  Procedures Procedures (including critical care time)  Medications Ordered in ED Medications - No data to display   Initial Impression / Assessment and Plan / ED Course  I have reviewed the triage vital signs and the nursing notes.  22 y.o. G1P0 with bleeding in early pregnancy stable for d/c with normal vital signs and minimal bleeding. Patient to keep her scheduled appointment for f/u ultrasound. Return precautions discussed.  Final Clinical Impressions(s) / ED Diagnoses   Final diagnoses:  Vaginal bleeding in pregnancy, first trimester    ED Discharge Orders    None

## 2017-11-16 NOTE — MAU Note (Signed)
Pt presents to MAU with c/o vaginal bleeding that started over two weeks ago, but has increased in the last 2 days. She has had bright red bleeding with pea-sized clots and abdominal cramping.

## 2017-11-16 NOTE — Discharge Instructions (Signed)
Vaginal Bleeding During Pregnancy, First Trimester °A small amount of bleeding (spotting) from the vagina is common in early pregnancy. Sometimes the bleeding is normal and is not a problem, and sometimes it is a sign of something serious. Be sure to tell your doctor about any bleeding from your vagina right away. °Follow these instructions at home: °· Watch your condition for any changes. °· Follow your doctor's instructions about how active you can be. °· If you are on bed rest: °? You may need to stay in bed and only get up to use the bathroom. °? You may be allowed to do some activities. °? If you need help, make plans for someone to help you. °· Write down: °? The number of pads you use each day. °? How often you change pads. °? How soaked (saturated) your pads are. °· Do not use tampons. °· Do not douche. °· Do not have sex or orgasms until your doctor says it is okay. °· If you pass any tissue from your vagina, save the tissue so you can show it to your doctor. °· Only take medicines as told by your doctor. °· Do not take aspirin because it can make you bleed. °· Keep all follow-up visits as told by your doctor. °Contact a doctor if: °· You bleed from your vagina. °· You have cramps. °· You have labor pains. °· You have a fever that does not go away after you take medicine. °Get help right away if: °· You have very bad cramps in your back or belly (abdomen). °· You pass large clots or tissue from your vagina. °· You bleed more. °· You feel light-headed or weak. °· You pass out (faint). °· You have chills. °· You are leaking fluid or have a gush of fluid from your vagina. °· You pass out while pooping (having a bowel movement). °This information is not intended to replace advice given to you by your health care provider. Make sure you discuss any questions you have with your health care provider. °Document Released: 08/07/2013 Document Revised: 08/29/2015 Document Reviewed: 11/28/2012 °Elsevier Interactive  Patient Education © 2018 [employer] ° °

## 2017-11-18 ENCOUNTER — Ambulatory Visit (HOSPITAL_COMMUNITY)
Admit: 2017-11-18 | Discharge: 2017-11-18 | Disposition: A | Discharge status: Home / Self Care | Payer: No Typology Code available for payment source | Attending: Medical | Admitting: Medical

## 2017-11-18 ENCOUNTER — Telehealth: Payer: Self-pay | Admitting: Medical

## 2017-11-18 DIAGNOSIS — O209 Hemorrhage in early pregnancy, unspecified: Secondary | ICD-10-CM | POA: Diagnosis not present

## 2017-11-18 DIAGNOSIS — O418X10 Other specified disorders of amniotic fluid and membranes, first trimester, not applicable or unspecified: Secondary | ICD-10-CM

## 2017-11-18 DIAGNOSIS — O30009 Twin pregnancy, unspecified number of placenta and unspecified number of amniotic sacs, unspecified trimester: Secondary | ICD-10-CM

## 2017-11-18 DIAGNOSIS — O30031 Twin pregnancy, monochorionic/diamniotic, first trimester: Secondary | ICD-10-CM

## 2017-11-18 DIAGNOSIS — O468X1 Other antepartum hemorrhage, first trimester: Secondary | ICD-10-CM

## 2017-11-18 NOTE — Telephone Encounter (Signed)
Discussed US results with patient. Further development noted of mono-mono twins but without FHR noted at this time. Not definitive for failed pregnancy, will re-scan in 1 week for viability as [facility]. Order placed. They will call with appointment time. Patient to follow-up with [facility] for results following US.   Marny LowensteinWenzel, Julie N, PA-C 11/18/2017 2:59 PM

## 2017-11-22 ENCOUNTER — Observation Stay
Admission: EM | Admit: 2017-11-22 | Discharge: 2017-11-23 | Disposition: A | Discharge status: Home / Self Care | Payer: No Typology Code available for payment source | Attending: Obstetrics and Gynecology | Admitting: Obstetrics and Gynecology

## 2017-11-22 ENCOUNTER — Emergency Department: Payer: No Typology Code available for payment source

## 2017-11-22 ENCOUNTER — Other Ambulatory Visit: Payer: Self-pay

## 2017-11-22 ENCOUNTER — Encounter: Payer: Self-pay | Admitting: Emergency Medicine

## 2017-11-22 DIAGNOSIS — Z882 Allergy status to sulfonamides status: Secondary | ICD-10-CM | POA: Insufficient documentation

## 2017-11-22 DIAGNOSIS — N939 Abnormal uterine and vaginal bleeding, unspecified: Secondary | ICD-10-CM | POA: Diagnosis present

## 2017-11-22 DIAGNOSIS — D62 Acute posthemorrhagic anemia: Secondary | ICD-10-CM | POA: Diagnosis not present

## 2017-11-22 DIAGNOSIS — O031 Delayed or excessive hemorrhage following incomplete spontaneous abortion: Principal | ICD-10-CM | POA: Diagnosis present

## 2017-11-22 DIAGNOSIS — O034 Incomplete spontaneous abortion without complication: Secondary | ICD-10-CM | POA: Diagnosis present

## 2017-11-22 HISTORY — DX: Other specified health status: Z78.9

## 2017-11-22 LAB — COMPREHENSIVE METABOLIC PANEL
ALT: 16 U/L (ref 0–44)
AST: 20 U/L (ref 15–41)
Albumin: 4.3 g/dL (ref 3.5–5.0)
Alkaline Phosphatase: 68 U/L (ref 38–126)
Anion gap: 9 (ref 5–15)
BILIRUBIN TOTAL: 0.4 mg/dL (ref 0.3–1.2)
BUN: 12 mg/dL (ref 6–20)
CO2: 22 mmol/L (ref 22–32)
CREATININE: 0.86 mg/dL (ref 0.44–1.00)
Calcium: 8.9 mg/dL (ref 8.9–10.3)
Chloride: 106 mmol/L (ref 98–111)
Glucose, Bld: 101 mg/dL — ABNORMAL HIGH (ref 70–99)
POTASSIUM: 3.7 mmol/L (ref 3.5–5.1)
Sodium: 137 mmol/L (ref 135–145)
TOTAL PROTEIN: 7.8 g/dL (ref 6.5–8.1)

## 2017-11-22 LAB — CBC WITH DIFFERENTIAL/PLATELET
BASOS ABS: 0 10*3/uL (ref 0–0.1)
Basophils Relative: 0 %
EOS PCT: 1 %
Eosinophils Absolute: 0.1 10*3/uL (ref 0–0.7)
HEMATOCRIT: 37.9 % (ref 35.0–47.0)
Hemoglobin: 12.6 g/dL (ref 12.0–16.0)
LYMPHS ABS: 4 10*3/uL — AB (ref 1.0–3.6)
Lymphocytes Relative: 30 %
MCH: 27.8 pg (ref 26.0–34.0)
MCHC: 33.2 g/dL (ref 32.0–36.0)
MCV: 83.8 fL (ref 80.0–100.0)
MONOS PCT: 6 %
Monocytes Absolute: 0.8 10*3/uL (ref 0.2–0.9)
NEUTROS ABS: 8.4 10*3/uL — AB (ref 1.4–6.5)
Neutrophils Relative %: 63 %
Platelets: 401 10*3/uL (ref 150–440)
RBC: 4.53 MIL/uL (ref 3.80–5.20)
RDW: 13.8 % (ref 11.5–14.5)
WBC: 13.3 10*3/uL — ABNORMAL HIGH (ref 3.6–11.0)

## 2017-11-22 LAB — HEMOGLOBIN AND HEMATOCRIT, BLOOD
HEMATOCRIT: 28.8 % — AB (ref 35.0–47.0)
Hemoglobin: 9.8 g/dL — ABNORMAL LOW (ref 12.0–16.0)

## 2017-11-22 LAB — PROTIME-INR
INR: 1.08
Prothrombin Time: 13.9 seconds (ref 11.4–15.2)

## 2017-11-22 LAB — APTT: aPTT: 30 seconds (ref 24–36)

## 2017-11-22 LAB — HCG, QUANTITATIVE, PREGNANCY: HCG, BETA CHAIN, QUANT, S: 17203 m[IU]/mL — AB (ref <5)

## 2017-11-22 MED ORDER — SODIUM CHLORIDE 0.9 % IV BOLUS
1000.0000 mL | Freq: Once | INTRAVENOUS | Status: AC
  Administered 2017-11-22: 1000 mL via INTRAVENOUS

## 2017-11-22 MED ORDER — HYDROCODONE-ACETAMINOPHEN 5-325 MG PO TABS
1.0000 | ORAL_TABLET | Freq: Once | ORAL | Status: AC
  Administered 2017-11-22: 1 via ORAL
  Filled 2017-11-22: qty 1

## 2017-11-22 MED ORDER — FENTANYL CITRATE (PF) 100 MCG/2ML IJ SOLN: 25.0000 ug | Freq: Once | INTRAMUSCULAR | Status: DC

## 2017-11-22 MED ORDER — FENTANYL CITRATE (PF) 100 MCG/2ML IJ SOLN
25.0000 ug | Freq: Once | INTRAMUSCULAR | Status: AC
  Administered 2017-11-22: 25 ug via INTRAVENOUS
  Filled 2017-11-22: qty 2

## 2017-11-22 NOTE — ED Triage Notes (Addendum)
Patient ambulatory to triage with steady gait, without difficulty or distress noted;pt reports vag bleeding and abd/back pain that increased this evening; approx 5wks twin pregnancy G1; [facility]; [facility] (u/s 8-14 indicated no heart beat with either embryo); pt's bood type A+ listed in chart

## 2017-11-22 NOTE — ED Notes (Signed)
Pt states she is having vag bleeding and cramping.  Pt is approx [redacted] weeks pregnant.  Sx began today.  [facility]

## 2017-11-22 NOTE — H&P (Addendum)
Preoperative/Admission History and Physical  Wanda Erickson is a 22 y.o. G1P0 here for surgical management of incomplete abortion.   No significant preoperative concerns.  History of Present Illness: 22 y.o. [id number] female who presents to the ER this evening for heavy vaginal bleeding. She is passing large amounts of clots and does not seem to be improving. She had an ultrasound on 11/18/17 that showed a mono-mono twin gestation dated at 423w5d on that date.  She had some spotting prior to presentation on 11/18/17 and she has been doing well since that time.  She began having heavy bleeding tonight. She denies fevers and chills. She has had some nausea and diarrhea. Nothing seems to make it better or worse.  The ER staff has had to change her bed clothing and Chux pad several times.  She had a drop in hemoglobin from 12.6 to 9.8 in 3 hours.   Proposed surgery: Suction, dilation and curettage.  Past Medical History:  Diagnosis Date  . No known health problems    Past Surgical History:  Procedure Laterality Date  . NO PAST SURGERIES     OB History  Gravida Para Term Preterm AB Living  1            SAB TAB Ectopic Multiple Live Births               # Outcome Date GA Lbr Len/2nd Weight Sex Delivery Anes PTL Lv  1 Current           Patient denies any other pertinent gynecologic issues.   No current facility-administered medications on file prior to encounter.    Current Outpatient Medications on File Prior to Encounter  Medication Sig Dispense Refill  . Prenatal Vit-Fe Fumarate-FA (PRENATAL COMPLETE) 14-0.4 MG TABS Take 1 tablet by mouth daily. 60 each 0   Allergies  Allergen Reactions  . Septra [Sulfamethoxazole-Trimethoprim]     Social History:   reports that she has never smoked. She has never used smokeless tobacco. She reports that she drank alcohol. She reports that she does not use drugs.  Family History: She denies a history of gynecologic cancers and any other medical  condition running in her family.  Review of Systems: Review of Systems  Constitutional: Negative.   HENT: Negative.   Eyes: Negative.   Cardiovascular: Negative.   Gastrointestinal: Negative.   Genitourinary: Negative.        See HPI  Musculoskeletal: Negative.   Skin: Negative.   Neurological: Negative.   Endo/Heme/Allergies: Negative.   Psychiatric/Behavioral: Negative.      PHYSICAL EXAM: Blood pressure 121/83, pulse 86, temperature 98.4 F (36.9 C), temperature source Oral, resp. rate 18, height 5\' 3"  (1.6 m), weight 73.9 kg, last menstrual period 09/19/2017, SpO2 98 %. Physical Exam  Constitutional: She is oriented to person, place, and time. She appears well-developed and well-nourished. She appears ill. No distress.  Appears pale  HENT:  Head: Normocephalic and atraumatic.  Eyes: Conjunctivae are normal. No scleral icterus.  Cardiovascular: Normal rate and regular rhythm. Exam reveals no gallop and no friction rub.  No murmur heard. Pulmonary/Chest: Effort normal and breath sounds normal. No respiratory distress. She has no wheezes. She has no rales.  Abdominal: Soft. Bowel sounds are normal. She exhibits no distension and no mass. There is no tenderness. There is no rebound and no guarding.  Genitourinary: Pelvic exam was performed with patient supine.  Genitourinary Comments: Dried blood on perineum. No active bleeding from vaginal opening. Speculum inserted and  patient immediately requested to have the speculum removed due to pain.  The speculum was removed per her request.   Musculoskeletal: Normal range of motion. She exhibits no edema or tenderness.  Neurological: She is alert and oriented to person, place, and time. No cranial nerve deficit.  Skin: Skin is warm and dry. No erythema.  Psychiatric: She has a normal mood and affect. Her behavior is normal. Judgment normal.    Female chaperone present for pelvic exam:   Labs: Results for orders placed or performed  during the hospital encounter of 11/22/17 (from the past 336 hour(s))  hCG, quantitative, pregnancy   Collection Time: 11/22/17  8:18 PM  Result Value Ref Range   hCG, Beta Chain, Quant, S 17,203 (H) <5 mIU/mL  CBC with Differential   Collection Time: 11/22/17  8:18 PM  Result Value Ref Range   WBC 13.3 (H) 3.6 - 11.0 K/uL   RBC 4.53 3.80 - 5.20 MIL/uL   Hemoglobin 12.6 12.0 - 16.0 g/dL   HCT 40.9 81.1 - 91.4 %   MCV 83.8 80.0 - 100.0 fL   MCH 27.8 26.0 - 34.0 pg   MCHC 33.2 32.0 - 36.0 g/dL   RDW 78.2 95.6 - 21.3 %   Platelets 401 150 - 440 K/uL   Neutrophils Relative % 63 %   Neutro Abs 8.4 (H) 1.4 - 6.5 K/uL   Lymphocytes Relative 30 %   Lymphs Abs 4.0 (H) 1.0 - 3.6 K/uL   Monocytes Relative 6 %   Monocytes Absolute 0.8 0.2 - 0.9 K/uL   Eosinophils Relative 1 %   Eosinophils Absolute 0.1 0 - 0.7 K/uL   Basophils Relative 0 %   Basophils Absolute 0.0 0 - 0.1 K/uL  Comprehensive metabolic panel   Collection Time: 11/22/17  8:18 PM  Result Value Ref Range   Sodium 137 135 - 145 mmol/L   Potassium 3.7 3.5 - 5.1 mmol/L   Chloride 106 98 - 111 mmol/L   CO2 22 22 - 32 mmol/L   Glucose, Bld 101 (H) 70 - 99 mg/dL   BUN 12 6 - 20 mg/dL   Creatinine, Ser 0.86 0.44 - 1.00 mg/dL   Calcium 8.9 8.9 - 57.8 mg/dL   Total Protein 7.8 6.5 - 8.1 g/dL   Albumin 4.3 3.5 - 5.0 g/dL   AST 20 15 - 41 U/L   ALT 16 0 - 44 U/L   Alkaline Phosphatase 68 38 - 126 U/L   Total Bilirubin 0.4 0.3 - 1.2 mg/dL   GFR calc non Af Amer >60 >60 mL/min   GFR calc Af Amer >60 >60 mL/min   Anion gap 9 5 - 15  Protime-INR   Collection Time: 11/22/17 10:11 PM  Result Value Ref Range   Prothrombin Time 13.9 11.4 - 15.2 seconds   INR 1.08   APTT   Collection Time: 11/22/17 10:11 PM  Result Value Ref Range   aPTT 30 24 - 36 seconds  Type and screen [facility]   Collection Time: 11/22/17 10:11 PM  Result Value Ref Range   ABO/RH(D) PENDING    Antibody Screen PENDING    Sample  Expiration      11/25/2017 Performed at [facility], [address] [place], [place] [address]   Hemoglobin and hematocrit, blood   Collection Time: 11/22/17 11:07 PM  Result Value Ref Range   Hemoglobin 9.8 (L) 12.0 - 16.0 g/dL   HCT 95.2 (L) 84.1 - 32.4 %  Type and screen Ordered by PROVIDER DEFAULT   Collection Time: 11/22/17 11:07 PM  Result Value Ref Range   ABO/RH(D) PENDING    Antibody Screen PENDING    Sample Expiration      11/25/2017 Performed at [facility], [address] [place], [place] [address]   Type and screen Ordered by PROVIDER DEFAULT   Collection Time: 11/22/17 11:29 PM  Result Value Ref Range   ABO/RH(D) PENDING    Antibody Screen PENDING    Sample Expiration      11/25/2017 Performed at [facility], [address] [place], [place] [address]   Results for orders placed or performed during the hospital encounter of 11/16/17 (from the past 336 hour(s))  Urinalysis, Routine w reflex microscopic   Collection Time: 11/16/17 12:03 PM  Result Value Ref Range   Color, Urine YELLOW YELLOW   APPearance CLEAR CLEAR   Specific Gravity, Urine 1.018 1.005 - 1.030   pH 8.0 5.0 - 8.0   Glucose, UA NEGATIVE NEGATIVE mg/dL   Hgb urine dipstick SMALL (A) NEGATIVE   Bilirubin Urine NEGATIVE NEGATIVE   Ketones, ur NEGATIVE NEGATIVE mg/dL   Protein, ur NEGATIVE NEGATIVE mg/dL   Nitrite NEGATIVE NEGATIVE   Leukocytes, UA NEGATIVE NEGATIVE   RBC / HPF 0-5 0 - 5 RBC/hpf   WBC, UA 0-5 0 - 5 WBC/hpf   Bacteria, UA NONE SEEN NONE SEEN   Squamous Epithelial / LPF 0-5 0 - 5   Mucus PRESENT   Results for orders placed or performed during the hospital encounter of 11/09/17 (from the past 336 hour(s))  Urinalysis, Routine w reflex microscopic   Collection Time: 11/09/17  2:36 PM  Result Value Ref Range   Color, Urine AMBER (A) YELLOW   APPearance CLOUDY (A) CLEAR   Specific Gravity, Urine 1.031 (H) 1.005 - 1.030   pH 6.0 5.0 - 8.0    Glucose, UA NEGATIVE NEGATIVE mg/dL   Hgb urine dipstick NEGATIVE NEGATIVE   Bilirubin Urine NEGATIVE NEGATIVE   Ketones, ur NEGATIVE NEGATIVE mg/dL   Protein, ur NEGATIVE NEGATIVE mg/dL   Nitrite NEGATIVE NEGATIVE   Leukocytes, UA NEGATIVE NEGATIVE  Pregnancy, urine POC   Collection Time: 11/09/17  2:39 PM  Result Value Ref Range   Preg Test, Ur POSITIVE (A) NEGATIVE    Imaging Studies: US Ob Transvaginal  Result Date: 11/22/2017 CLINICAL DATA:  Vaginal bleeding with lower abdominal pain EXAM: OBSTETRIC <14 WK [place] AND TRANSVAGINAL OB US TECHNIQUE: Both transabdominal and transvaginal ultrasound examinations were performed for complete evaluation of the gestation as well as the maternal uterus, adnexal regions, and pelvic cul-de-sac. Transvaginal technique was performed to assess early pregnancy. COMPARISON:  11/18/2017, 11/09/2017, 11/06/2017 FINDINGS: Intrauterine gestational sac: Visualized within the cervix. Yolk sac:  Visualized within the gestational sac in the cervix. Embryo:  Visualized within the gestational sac in the cervix. Cardiac Activity: Not documented MSD: 14.6 mm   6 w   2 d Subchorionic hemorrhage:  None visualized. Maternal uterus/adnexae: Left ovary is not seen. Small cystic area in the right adnexa on transabdominal images only. Heterogenous thickening of the endometrium up to 19.2 mm. Vaginal exam was prematurely terminated at patient request. The adnexa were therefore not evaluated at transvaginal imaging. No significant free fluid IMPRESSION: Heterogenous thickening of the endometrial stripe up to 19.2 mm likely hemorrhagic product. The previously noted gestational sac and its contents are now visible within the cervix at the time of imaging. Adnexa were not able to  be adequately evaluated due to premature termination of transvaginal images at patient request. Electronically Signed   By: Jasmine PangKim  Fujinaga M.D.   On: 11/22/2017 23:39   [place] Ob Transvaginal  Result Date:  11/18/2017 CLINICAL DATA:  Followup viability. EXAM: US OB TRANSVAGINAL COMPARISON:  11/09/2017 FINDINGS: Number of IUPs:  2 Chorionicity/Amnionicity: Monochorionic-monoamniotic (no separating membrane seen) TWIN 1 Yolk sac:  Visualized Embryo:  Visualized Cardiac Activity: Not visualize CRL:  2.24 mm   5 w 5 d                  US EDC: 07/16/2018 TWIN 2 Yolk sac:  Visualized Embryo:  Visualized Cardiac Activity: Not visualize Heart Rate:  bpm CRL:  2.28 mm   5 w 5 d                  US EDC: 07/16/2018 Subchorionic hemorrhage:  None visualized. Maternal uterus/adnexae: Right ovary: Normal Left ovary: Normal Other :None Free fluid:  None IMPRESSION: 1. Monochorionic-monoamniotic twin gestation identified. No heartbeat identified within either embryo. Findings are suspicious but not yet definitive for failed pregnancy. Recommend follow-up US in 10-14 days for definitive diagnosis. This recommendation follows SRU consensus guidelines: Diagnostic Criteria for Nonviable Pregnancy Early in the First Trimester. Malva Limes Engl J Med 2013; 147:8295-62; 369:1443-51. Electronically Signed   By: Signa Kellaylor  Stroud M.D.   On: 11/18/2017 11:34   Assessment: Patient Active Problem List   Diagnosis Date Noted  . Incomplete abortion 11/23/2017  . Anemia due to acute blood loss 11/23/2017    Plan: Patient will undergo surgical management with suction, dilation and curettage.   The risks of surgery were discussed in detail with the patient including but not limited to: bleeding which may require transfusion or reoperation; infection which may require antibiotics; injury to surrounding organs which may involve bowel, bladder, ureters ; need for additional procedures including laparoscopy or laparotomy; thromboembolic phenomenon, surgical site problems and other postoperative/anesthesia complications. Likelihood of success in alleviating the patient's condition was discussed. The patient concurred with the proposed plan, giving informed written consent  for the surgery.  Patient has been NPO for the past 5 hours.  Anesthesia and OR aware.  Preoperative prophylactic antibiotics, as indicated, and SCDs ordered on call to the OR.  To OR when ready.  Given her quick drop in blood counts, will transfuse 1 unit pRBCs.    Thomasene MohairStephen Marzella Miracle, MD 11/23/2017 12:06 AM

## 2017-11-22 NOTE — ED Notes (Signed)
Pt signed op permit for consent.  Family with pt.

## 2017-11-22 NOTE — ED Notes (Signed)
ED Provider at bedside. 

## 2017-11-22 NOTE — ED Notes (Signed)
Patient transported to Ultrasound 

## 2017-11-22 NOTE — ED Notes (Signed)
Dr Jean Rosenthaljackson in with pt

## 2017-11-22 NOTE — ED Provider Notes (Signed)
[facility] Emergency Department Provider Note   ____________________________________________   First MD Initiated Contact with Patient 11/22/17 2112     (approximate)  I have reviewed the triage vital signs and the nursing notes.   HISTORY  Chief Complaint Vaginal Bleeding    HPI Wanda Erickson is a 22 y.o. female presents for evaluation of vaginal bleeding  Patient reports recently found to be of pregnant, believes her about 5-1/2 weeks.  An ultrasound it was along well 5 days ago  Tonight about 1 hour ago she started having severe crampy abdominal pain in the lower pelvis and heavy vaginal bleeding.  Reports she is gone through about 4 pads an hour and she soaked through those in the last hour, continue to bleed through her pants.  She reports a severe pain lower abdomen.  Has also been passing large clots.  Takes no blood thinners.  Did not use any pregnancy inducing medications or fertility treatments.  Never previously pregnant but was told this was a twin pregnancy.  Had bleeding earlier in the pregnancy and was told she had a subchorionic hemorrhage  Past Medical History:  Diagnosis Date  . No known health problems     Patient Active Problem List   Diagnosis Date Noted  . Incomplete abortion 11/23/2017  . Anemia due to acute blood loss 11/23/2017    Past Surgical History:  Procedure Laterality Date  . NO PAST SURGERIES      Prior to Admission medications   Medication Sig Start Date End Date Taking? Authorizing Provider  Prenatal Vit-Fe Fumarate-FA (PRENATAL COMPLETE) 14-0.4 MG TABS Take 1 tablet by mouth daily. 11/06/17   Khatri, Hina, PA-C    Allergies Septra [sulfamethoxazole-trimethoprim]  No family history on file.  Social History Social History   Tobacco Use  . Smoking status: Never Smoker  . Smokeless tobacco: Never Used  Substance Use Topics  . Alcohol use: Not Currently  . Drug use: Never    Review of  Systems Constitutional: No fever/chills Eyes: No visual changes. ENT: No sore throat. Cardiovascular: Denies chest pain. Respiratory: Denies shortness of breath. Gastrointestinal:  No nausea, no vomiting.  No diarrhea.  No constipation. Genitourinary: Negative for dysuria.  See HPI regarding GYN symptoms Musculoskeletal: Negative for back pain. Skin: Negative for rash. Neurological: Negative for headaches.    ____________________________________________   PHYSICAL EXAM:  VITAL SIGNS: ED Triage Vitals  Enc Vitals Group     BP 11/22/17 2012 (!) 134/100     Pulse Rate 11/22/17 2012 (!) 111     Resp 11/22/17 2012 18     Temp 11/22/17 2012 98.4 F (36.9 C)     Temp Source 11/22/17 2012 Oral     SpO2 11/22/17 2012 99 %     Weight 11/22/17 2007 163 lb (73.9 kg)     Height 11/22/17 2007 5\' 3"  (1.6 m)     Head Circumference --      Peak Flow --      Pain Score 11/22/17 2007 7     Pain Loc --      Pain Edu? --      Excl. in GC? --     Constitutional: Alert and oriented.  Sitting upright.  Appears in severe pain.  Holding her lower abdomen. Eyes: Conjunctivae are normal. Head: Atraumatic. Nose: No congestion/rhinnorhea. Mouth/Throat: Mucous membranes are moist. Neck: No stridor.   Cardiovascular: Normal rate, regular rhythm. Grossly normal heart sounds.  Good peripheral circulation. Respiratory: Normal respiratory  effort.  No retractions. Lungs CTAB. Gastrointestinal: Soft and nontender. No distention. Musculoskeletal: No lower extremity tenderness nor edema. Gynecologic: Exam with nurse Amy, attempted speculum examination but patient having large amount of bleeding.  Large amount of clots, pain and discomfort and unable to perform speculum exam.  The patient is however notably passing clots with some present bright red bleeding. Neurologic:  Normal speech and language. No gross focal neurologic deficits are appreciated.  Skin:  Skin is warm, dry and intact. No rash  noted. Psychiatric: Mood and affect are normal. Speech and behavior are normal.  ____________________________________________   LABS (all labs ordered are listed, but only abnormal results are displayed)  Labs Reviewed  HCG, QUANTITATIVE, PREGNANCY - Abnormal; Notable for the following components:      Result Value   hCG, Beta Chain, Quant, S 17,203 (*)    All other components within normal limits  CBC WITH DIFFERENTIAL/PLATELET - Abnormal; Notable for the following components:   WBC 13.3 (*)    Neutro Abs 8.4 (*)    Lymphs Abs 4.0 (*)    All other components within normal limits  COMPREHENSIVE METABOLIC PANEL - Abnormal; Notable for the following components:   Glucose, Bld 101 (*)    All other components within normal limits  HEMOGLOBIN AND HEMATOCRIT, BLOOD - Abnormal; Notable for the following components:   Hemoglobin 9.8 (*)    HCT 28.8 (*)    All other components within normal limits  PROTIME-INR  APTT  URINALYSIS, COMPLETE (UACMP) WITH MICROSCOPIC  TYPE AND SCREEN  TYPE AND SCREEN  TYPE AND SCREEN  PREPARE RBC (CROSSMATCH)   ____________________________________________  EKG   ____________________________________________  RADIOLOGY  [place] Ob Comp Less 14 Wks  Result Date: 11/22/2017 CLINICAL DATA:  Vaginal bleeding with lower abdominal pain EXAM: OBSTETRIC <14 WK US AND TRANSVAGINAL OB US TECHNIQUE: Both transabdominal and transvaginal ultrasound examinations were performed for complete evaluation of the gestation as well as the maternal uterus, adnexal regions, and pelvic cul-de-sac. Transvaginal technique was performed to assess early pregnancy. COMPARISON:  11/18/2017, 11/09/2017, 11/06/2017 FINDINGS: Intrauterine gestational sac: Visualized within the cervix. Yolk sac:  Visualized within the gestational sac in the cervix. Embryo:  Visualized within the gestational sac in the cervix. Cardiac Activity: Not documented MSD: 14.6 mm   6 w   2 d Subchorionic hemorrhage:   None visualized. Maternal uterus/adnexae: Left ovary is not seen. Small cystic area in the right adnexa on transabdominal images only. Heterogenous thickening of the endometrium up to 19.2 mm. Vaginal exam was prematurely terminated at patient request. The adnexa were therefore not evaluated at transvaginal imaging. No significant free fluid IMPRESSION: Heterogenous thickening of the endometrial stripe up to 19.2 mm likely hemorrhagic product. The previously noted gestational sac and its contents are now visible within the cervix at the time of imaging. Adnexa were not able to be adequately evaluated due to premature termination of transvaginal images at patient request. Electronically Signed   By: Jasmine PangKim  Fujinaga M.D.   On: 11/22/2017 23:39   [place] Ob Transvaginal  Result Date: 11/22/2017 CLINICAL DATA:  Vaginal bleeding with lower abdominal pain EXAM: OBSTETRIC <14 WK US AND TRANSVAGINAL OB US TECHNIQUE: Both transabdominal and transvaginal ultrasound examinations were performed for complete evaluation of the gestation as well as the maternal uterus, adnexal regions, and pelvic cul-de-sac. Transvaginal technique was performed to assess early pregnancy. COMPARISON:  11/18/2017, 11/09/2017, 11/06/2017 FINDINGS: Intrauterine gestational sac: Visualized within the cervix. Yolk sac:  Visualized within the gestational sac  in the cervix. Embryo:  Visualized within the gestational sac in the cervix. Cardiac Activity: Not documented MSD: 14.6 mm   6 w   2 d Subchorionic hemorrhage:  None visualized. Maternal uterus/adnexae: Left ovary is not seen. Small cystic area in the right adnexa on transabdominal images only. Heterogenous thickening of the endometrium up to 19.2 mm. Vaginal exam was prematurely terminated at patient request. The adnexa were therefore not evaluated at transvaginal imaging. No significant free fluid IMPRESSION: Heterogenous thickening of the endometrial stripe up to 19.2 mm likely hemorrhagic product.  The previously noted gestational sac and its contents are now visible within the cervix at the time of imaging. Adnexa were not able to be adequately evaluated due to premature termination of transvaginal images at patient request. Electronically Signed   By: Jasmine Pang M.D.   On: 11/22/2017 23:39    ____________________________________________   PROCEDURES  Procedure(s) performed: None  Procedures  Critical Care performed: Yes, see critical care note(s)  CRITICAL CARE Performed by: Sharyn Creamer   Total critical care time: 45 minutes  Critical care time was exclusive of separately billable procedures and treating other patients.  Critical care was necessary to treat or prevent imminent or life-threatening deterioration.  Critical care was time spent personally by me on the following activities: development of treatment plan with patient and/or surrogate as well as nursing, discussions with consultants, evaluation of patient's response to treatment, examination of patient, obtaining history from patient or surrogate, ordering and performing treatments and interventions, ordering and review of laboratory studies, ordering and review of radiographic studies, pulse oximetry and re-evaluation of patient's condition.  ____________________________________________   INITIAL IMPRESSION / ASSESSMENT AND PLAN / ED COURSE  Pertinent labs & imaging results that were available during my care of the patient were reviewed by me and considered in my medical decision making (see chart for details).  Patient presents with previously documented pregnancy now with lower abdominal pain and notable bleeding and clot passage.  Differential diagnosis includes potential miscarriage, acute hemorrhage, placental abruption, ectopic pregnancy etc.  Notable vaginal bleeding  Clinical Course as of Nov 24 7  Mon Nov 22, 2017  2320 Patient verbally consenting to blood transfusion.  Her vital signs are  presently normal, but she is in a fair amount of pain reporting crampy lower abdominal pain for which I have ordered small dose of fentanyl.  She continues to look pale and in pain, patient's H&H has had a significant drop I would anticipate blood transfusion that she continues to have bleeding, though with her stable vital signs I do not wish to proceed with emergency blood at this time.   [MQ]  2321 Dr. Jean Rosenthal in route for emergent consult.   [MQ]    Clinical Course User Index [MQ] Sharyn Creamer, MD   ----------------------------------------- 10:58 PM on 11/22/2017 -----------------------------------------  Patient returned from ultrasound.  She looks slightly pale, reports she feels slightly lightheaded.  She is continuing to bleed passing vaginal clots, and overall appears to be more fatigued and pale looking.  In order liter of IV fluid, repeat hemoglobin hematocrit.  Have called Dr. Jean Rosenthal, updated him on clinical status and he is in route for stat evaluation.  ----------------------------------------- 11:56 PM on 11/22/2017 ----------------------------------------- Patient alert, oriented still with stable vital signs.  Continue to look pale.  Consented for blood product.  ----------------------------------------- 12:08 AM on 11/23/2017 ----------------------------------------- Patient currently on her way to the OR, 1 unit of emergent blood has been ordered.  Patient  hemoglobin has dropped notably, her vital signs remains normal without evidence of hemorrhagic shock other than pale in appearance but given her almost three-point drop in 3 hours and ongoing bleeding have ordered 1 unit of blood at this time.  Currently on her way to the OR with Dr. Jean Rosenthal.  Vitals:   11/22/17 2320 11/22/17 2340  BP: 106/67 121/83  Pulse: 84 86  Resp:    Temp:    SpO2: 98% 98%     ____________________________________________   FINAL CLINICAL IMPRESSION(S) / ED DIAGNOSES  Final diagnoses:   Vaginal bleeding      NEW MEDICATIONS STARTED DURING THIS VISIT:  New Prescriptions   No medications on file     Note:  This document was prepared using Dragon voice recognition software and may include unintentional dictation errors.     Sharyn Creamer, MD 11/23/17 0010

## 2017-11-23 ENCOUNTER — Emergency Department: Payer: No Typology Code available for payment source | Admitting: Anesthesiology

## 2017-11-23 ENCOUNTER — Encounter: Payer: Self-pay | Admitting: Obstetrics and Gynecology

## 2017-11-23 ENCOUNTER — Encounter: Payer: No Typology Code available for payment source | Admitting: Obstetrics and Gynecology

## 2017-11-23 ENCOUNTER — Other Ambulatory Visit: Payer: Self-pay

## 2017-11-23 ENCOUNTER — Encounter
Admission: EM | Disposition: A | Discharge status: Home / Self Care | Payer: Self-pay | Source: Home / Self Care | Attending: Emergency Medicine

## 2017-11-23 DIAGNOSIS — N939 Abnormal uterine and vaginal bleeding, unspecified: Secondary | ICD-10-CM | POA: Diagnosis present

## 2017-11-23 DIAGNOSIS — D62 Acute posthemorrhagic anemia: Secondary | ICD-10-CM

## 2017-11-23 DIAGNOSIS — O031 Delayed or excessive hemorrhage following incomplete spontaneous abortion: Secondary | ICD-10-CM

## 2017-11-23 DIAGNOSIS — O034 Incomplete spontaneous abortion without complication: Secondary | ICD-10-CM

## 2017-11-23 HISTORY — DX: Delayed or excessive hemorrhage following incomplete spontaneous abortion: O03.1

## 2017-11-23 HISTORY — DX: Acute posthemorrhagic anemia: D62

## 2017-11-23 HISTORY — PX: DILATION AND EVACUATION: SHX1459

## 2017-11-23 HISTORY — DX: Incomplete spontaneous abortion without complication: O03.4

## 2017-11-23 LAB — CBC
HCT: 27.7 % — ABNORMAL LOW (ref 35.0–47.0)
Hemoglobin: 9.3 g/dL — ABNORMAL LOW (ref 12.0–16.0)
MCH: 28.5 pg (ref 26.0–34.0)
MCHC: 33.6 g/dL (ref 32.0–36.0)
MCV: 84.6 fL (ref 80.0–100.0)
Platelets: 274 10*3/uL (ref 150–440)
RBC: 3.28 MIL/uL — AB (ref 3.80–5.20)
RDW: 13.5 % (ref 11.5–14.5)
WBC: 15.5 10*3/uL — ABNORMAL HIGH (ref 3.6–11.0)

## 2017-11-23 LAB — TYPE AND SCREEN
ABO/RH(D): A POS
ANTIBODY SCREEN: NEGATIVE
UNIT DIVISION: 0
UNIT DIVISION: 0
Unit division: 0

## 2017-11-23 LAB — BPAM RBC
BLOOD PRODUCT EXPIRATION DATE: 201909152359
Blood Product Expiration Date: 201909092359
Blood Product Expiration Date: 201909152359
ISSUE DATE / TIME: 201908200051
UNIT TYPE AND RH: 6200
UNIT TYPE AND RH: 6200
Unit Type and Rh: 6200

## 2017-11-23 LAB — PREPARE RBC (CROSSMATCH)

## 2017-11-23 LAB — ABO/RH: ABO/RH(D): A POS

## 2017-11-23 SURGERY — DILATION AND EVACUATION, UTERUS
Anesthesia: General | Site: Vagina | Wound class: Clean Contaminated

## 2017-11-23 MED ORDER — MIDAZOLAM HCL 2 MG/2ML IJ SOLN
INTRAMUSCULAR | Status: DC | PRN
  Administered 2017-11-23: 2 mg via INTRAVENOUS

## 2017-11-23 MED ORDER — DOCUSATE SODIUM 100 MG PO CAPS: 100.0000 mg | ORAL_CAPSULE | Freq: Two times a day (BID) | ORAL | Status: DC

## 2017-11-23 MED ORDER — MIDAZOLAM HCL 2 MG/2ML IJ SOLN
INTRAMUSCULAR | Status: AC
  Filled 2017-11-23: qty 2

## 2017-11-23 MED ORDER — LACTATED RINGERS IV SOLN: INTRAVENOUS | Status: DC

## 2017-11-23 MED ORDER — SILVER NITRATE-POT NITRATE 75-25 % EX MISC
CUTANEOUS | Status: DC | PRN
  Administered 2017-11-23: 2

## 2017-11-23 MED ORDER — LACTATED RINGERS IV SOLN
INTRAVENOUS | Status: DC | PRN
  Administered 2017-11-23: via INTRAVENOUS

## 2017-11-23 MED ORDER — SILVER NITRATE-POT NITRATE 75-25 % EX MISC
CUTANEOUS | Status: AC
  Filled 2017-11-23: qty 1

## 2017-11-23 MED ORDER — SODIUM CHLORIDE 0.9 % IV SOLN: 10.0000 mL/h | Freq: Once | INTRAVENOUS | Status: DC

## 2017-11-23 MED ORDER — PROPOFOL 10 MG/ML IV BOLUS
INTRAVENOUS | Status: AC
  Filled 2017-11-23: qty 20

## 2017-11-23 MED ORDER — FENTANYL CITRATE (PF) 100 MCG/2ML IJ SOLN
25.0000 ug | INTRAMUSCULAR | Status: DC | PRN
  Administered 2017-11-23 (×4): 25 ug via INTRAVENOUS

## 2017-11-23 MED ORDER — LIDOCAINE HCL (CARDIAC) PF 100 MG/5ML IV SOSY
PREFILLED_SYRINGE | INTRAVENOUS | Status: DC | PRN
  Administered 2017-11-23: 60 mg via INTRAVENOUS

## 2017-11-23 MED ORDER — MENTHOL 3 MG MT LOZG
1.0000 | LOZENGE | OROMUCOSAL | Status: DC | PRN
  Filled 2017-11-23: qty 9

## 2017-11-23 MED ORDER — PROPOFOL 10 MG/ML IV BOLUS
INTRAVENOUS | Status: DC | PRN
  Administered 2017-11-23: 150 mg via INTRAVENOUS

## 2017-11-23 MED ORDER — METHYLERGONOVINE MALEATE 0.2 MG/ML IJ SOLN
INTRAMUSCULAR | Status: DC | PRN
  Administered 2017-11-23: 0.2 mg via INTRAMUSCULAR

## 2017-11-23 MED ORDER — FENTANYL CITRATE (PF) 100 MCG/2ML IJ SOLN
INTRAMUSCULAR | Status: AC
  Filled 2017-11-23: qty 2

## 2017-11-23 MED ORDER — LACTATED RINGERS IV SOLN
INTRAVENOUS | Status: DC
  Administered 2017-11-23: 02:00:00 via INTRAVENOUS

## 2017-11-23 MED ORDER — SUCCINYLCHOLINE CHLORIDE 20 MG/ML IJ SOLN
INTRAMUSCULAR | Status: AC
  Filled 2017-11-23: qty 1

## 2017-11-23 MED ORDER — ONDANSETRON HCL 4 MG PO TABS: 4.0000 mg | ORAL_TABLET | Freq: Four times a day (QID) | ORAL | Status: DC | PRN

## 2017-11-23 MED ORDER — ONDANSETRON HCL 4 MG/2ML IJ SOLN
INTRAMUSCULAR | Status: DC | PRN
  Administered 2017-11-23: 4 mg via INTRAVENOUS

## 2017-11-23 MED ORDER — HYDROCODONE-ACETAMINOPHEN 5-325 MG PO TABS
1.0000 | ORAL_TABLET | ORAL | Status: DC | PRN
  Administered 2017-11-23 (×2): 1 via ORAL
  Filled 2017-11-23 (×2): qty 1

## 2017-11-23 MED ORDER — HYDROMORPHONE HCL 1 MG/ML IJ SOLN: 1.0000 mg | INTRAMUSCULAR | Status: DC | PRN

## 2017-11-23 MED ORDER — SIMETHICONE 80 MG PO CHEW: 80.0000 mg | CHEWABLE_TABLET | Freq: Four times a day (QID) | ORAL | Status: DC | PRN

## 2017-11-23 MED ORDER — IBUPROFEN 600 MG PO TABS: 600.0000 mg | ORAL_TABLET | Freq: Four times a day (QID) | ORAL | 0 refills | Status: DC | PRN

## 2017-11-23 MED ORDER — ONDANSETRON HCL 4 MG/2ML IJ SOLN: 4.0000 mg | Freq: Four times a day (QID) | INTRAMUSCULAR | Status: DC | PRN

## 2017-11-23 MED ORDER — CARBOPROST TROMETHAMINE 250 MCG/ML IM SOLN
INTRAMUSCULAR | Status: AC
  Filled 2017-11-23: qty 1

## 2017-11-23 MED ORDER — METHYLERGONOVINE MALEATE 0.2 MG PO TABS
0.2000 mg | ORAL_TABLET | Freq: Four times a day (QID) | ORAL | Status: DC
  Administered 2017-11-23: 0.2 mg via ORAL
  Filled 2017-11-23: qty 1

## 2017-11-23 MED ORDER — FENTANYL CITRATE (PF) 100 MCG/2ML IJ SOLN
INTRAMUSCULAR | Status: AC
  Administered 2017-11-23: 25 ug via INTRAVENOUS
  Filled 2017-11-23: qty 2

## 2017-11-23 MED ORDER — PROMETHAZINE HCL 25 MG/ML IJ SOLN: 6.2500 mg | INTRAMUSCULAR | Status: DC | PRN

## 2017-11-23 MED ORDER — SUCCINYLCHOLINE CHLORIDE 20 MG/ML IJ SOLN
INTRAMUSCULAR | Status: DC | PRN
  Administered 2017-11-23: 100 mg via INTRAVENOUS

## 2017-11-23 MED ORDER — HYDROCODONE-ACETAMINOPHEN 5-325 MG PO TABS: 1.0000 | ORAL_TABLET | Freq: Four times a day (QID) | ORAL | 0 refills | Status: DC | PRN

## 2017-11-23 MED ORDER — DOXYCYCLINE HYCLATE 100 MG IV SOLR
200.0000 mg | INTRAVENOUS | Status: AC
  Administered 2017-11-23: 100 mg via INTRAVENOUS
  Filled 2017-11-23: qty 200

## 2017-11-23 MED ORDER — METHYLERGONOVINE MALEATE 0.2 MG/ML IJ SOLN
INTRAMUSCULAR | Status: AC
  Filled 2017-11-23: qty 1

## 2017-11-23 MED ORDER — IBUPROFEN 600 MG PO TABS: 600.0000 mg | ORAL_TABLET | Freq: Four times a day (QID) | ORAL | Status: DC | PRN

## 2017-11-23 MED ORDER — FENTANYL CITRATE (PF) 100 MCG/2ML IJ SOLN
INTRAMUSCULAR | Status: DC | PRN
  Administered 2017-11-23 (×2): 50 ug via INTRAVENOUS

## 2017-11-23 SURGICAL SUPPLY — 22 items
BAG URINE DRAINAGE (UROLOGICAL SUPPLIES) IMPLANT
CATH FOLEY 2WAY  5CC 16FR (CATHETERS)
CATH ROBINSON RED A/P 16FR (CATHETERS) ×3 IMPLANT
CATH URTH 16FR FL 2W BLN LF (CATHETERS) IMPLANT
FILTER UTR ASPR SPEC (MISCELLANEOUS) ×1 IMPLANT
FLTR UTR ASPR SPEC (MISCELLANEOUS) ×3
GLOVE BIO SURGEON STRL SZ7 (GLOVE) ×6 IMPLANT
GOWN STRL REUS W/ TWL LRG LVL3 (GOWN DISPOSABLE) ×2 IMPLANT
GOWN STRL REUS W/TWL LRG LVL3 (GOWN DISPOSABLE) ×4
KIT BERKELEY 1ST TRIMESTER 3/8 (MISCELLANEOUS) ×3 IMPLANT
KIT TURNOVER CYSTO (KITS) ×3 IMPLANT
NS IRRIG 500ML POUR BTL (IV SOLUTION) ×3 IMPLANT
PACK DNC HYST (MISCELLANEOUS) ×3 IMPLANT
PAD OB MATERNITY 4.3X12.25 (PERSONAL CARE ITEMS) ×3 IMPLANT
PAD PREP 24X41 OB/GYN DISP (PERSONAL CARE ITEMS) ×3 IMPLANT
SET BERKELEY SUCTION TUBING (SUCTIONS) ×3 IMPLANT
TOWEL OR 17X26 4PK STRL BLUE (TOWEL DISPOSABLE) ×3 IMPLANT
VACURETTE 10 RIGID CVD (CANNULA) IMPLANT
VACURETTE 12 RIGID CVD (CANNULA) IMPLANT
VACURETTE 7MM F TIP (CANNULA) ×2
VACURETTE 7MM F TIP STRL (CANNULA) ×1 IMPLANT
VACURETTE 8 RIGID CVD (CANNULA) IMPLANT
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
## 2017-11-23 NOTE — Transfer of Care (Signed)
Immediate Anesthesia Transfer of Care Note  Patient: Wanda Erickson  Procedure(s) Performed: DILATATION AND EVACUATION (N/A Vagina )  Patient Location: PACU  Anesthesia Type:General  Level of Consciousness: awake, alert , oriented and patient cooperative  Airway & Oxygen Therapy: Patient Spontanous Breathing  Post-op Assessment: Report given to RN and Post -op Vital signs reviewed and stable  Post vital signs: Reviewed and stable  Last Vitals:  Vitals Value Taken Time  BP 98/70 11/23/2017  1:14 AM  Temp    Pulse 126 11/23/2017  1:15 AM  Resp 14 11/23/2017  1:15 AM  SpO2 100 % 11/23/2017  1:15 AM  Vitals shown include unvalidated device data.  Last Pain:  Vitals:   11/22/17 2334  TempSrc:   PainSc: 10-Worst pain ever      Patients Stated Pain Goal: 10 (11/22/17 2334)  Complications: No apparent anesthesia complications
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
## 2017-11-23 NOTE — Discharge Summary (Signed)
DC Summary Discharge Summary   Patient ID: Wanda FootmanSabrena Erickson [id number] 22 y.o. 1995/09/04  Admit date: 11/22/2017  Discharge date: 11/23/2017  Principal Diagnoses:  1) incomplete spontaneous abortion 2) uterine hemorrhage 3) acute blood loss anemia  Secondary Diagnoses:  none  Procedures performed during the hospitalization:  Suction, dilation and curettage  HPI: G1P0000 female who presents to the ER this evening for heavy vaginal bleeding. She is passing large amounts of clots and does not seem to be improving. She had an ultrasound on 11/18/17 that showed a mono-mono twin gestation dated at 3434w5d on that date.  She had some spotting prior to presentation on 11/18/17 and she has been doing well since that time.  She began having heavy bleeding tonight. She denies fevers and chills. She has had some nausea and diarrhea. Nothing seems to make it better or worse.  The ER staff has had to change her bed clothing and Chux pad several times.  She had a drop in hemoglobin from 12.6 to 9.8 in 3 hours.   Past Medical History:  Diagnosis Date  . No known health problems     Past Surgical History:  Procedure Laterality Date  . NO PAST SURGERIES      Allergies  Allergen Reactions  . Septra [Sulfamethoxazole-Trimethoprim]     Social History   Tobacco Use  . Smoking status: Never Smoker  . Smokeless tobacco: Never Used  Substance Use Topics  . Alcohol use: Not Currently  . Drug use: Never   Family History: denies family history of gynecologic surgery  Hospital Course:  The patient was admitted from the ER and taken directly to the operating room where she underwent a suction, dilation and curettage, which occurred without difficulty.  Given it was the middle of the night and she had significant blood loss, she was monitored for several hours overnight. Her blood counts were repeated several hours after her surgery and about 6 hours after the previous hemoglobin/hematocrit. The blood  counts were essentially stable.  On the morning after her surgery, she was ambulating, tolerating PO, her pain was well-controlled on PO medication.  Her bleeding was minimal and certainly much-improved from before the surgery.  She had voided multiple times postoperatively. She had ambulated around the RN station multiple times without difficulty. She was considered to be stable for discharge.   Discharge Exam: BP 104/66 (BP Location: Right Arm)   Pulse 96   Temp 98 F (36.7 C) (Oral)   Resp 14   Ht 5\' 3"  (1.6 m)   Wt 73.9 kg   LMP 09/19/2017 (Exact Date)   SpO2 100%   BMI 28.87 kg/m  Physical Exam  Constitutional: She is oriented to person, place, and time. She appears well-developed and well-nourished. No distress.  HENT:  Head: Normocephalic and atraumatic.  Eyes: Conjunctivae are normal. No scleral icterus.  Cardiovascular: Normal rate and regular rhythm. Exam reveals no friction rub.  No murmur heard. Pulmonary/Chest: Effort normal and breath sounds normal. No respiratory distress. She has no wheezes. She has no rales.  Abdominal: Soft. Bowel sounds are normal. She exhibits no distension and no mass. There is no tenderness. There is no rebound and no guarding.  Musculoskeletal: Normal range of motion. She exhibits no edema.  Neurological: She is alert and oriented to person, place, and time. No cranial nerve deficit.  Skin: Skin is warm and dry. There is pallor (mild and stable from pre-operatively).  Psychiatric: She has a normal mood and affect. Her behavior  is normal. Judgment normal.      Condition at Discharge: Improved  Complications affecting treatment: None  Discharge Medications:  Allergies as of 11/23/2017      Reactions   Septra [sulfamethoxazole-trimethoprim]       Medication List    TAKE these medications   HYDROcodone-acetaminophen 5-325 MG tablet Commonly known as:  NORCO/VICODIN Take 1 tablet by mouth every 6 (six) hours as needed (breakthrough  pain).   ibuprofen 600 MG tablet Commonly known as:  ADVIL,MOTRIN Take 1 tablet (600 mg total) by mouth every 6 (six) hours as needed (mild pain).   PRENATAL COMPLETE 14-0.4 MG Tabs Take 1 tablet by mouth daily.       Follow-up arrangements:  Follow-up Information    Conard NovakJackson, Corlis Angelica D, MD In 2 weeks.   Specialty:  Obstetrics and Gynecology Why:  post op follow up Contact information: [address] [place] [address] [phone]          Discharge Disposition: Home to self care  Signed: Thomasene MohairStephen Eustace Hur, MD 11/23/2017 10:07 AM

## 2017-11-23 NOTE — Anesthesia Postprocedure Evaluation (Signed)
Anesthesia Post Note  Patient: Jimmy FootmanSabrena Denardo  Procedure(s) Performed: DILATATION AND EVACUATION (N/A Vagina )  Patient location during evaluation: PACU Anesthesia Type: General Level of consciousness: awake and alert Pain management: pain level controlled Vital Signs Assessment: post-procedure vital signs reviewed and stable Respiratory status: spontaneous breathing, nonlabored ventilation, respiratory function stable and patient connected to nasal cannula oxygen Cardiovascular status: blood pressure returned to baseline and stable Postop Assessment: no apparent nausea or vomiting Anesthetic complications: no     Last Vitals:  Vitals:   11/23/17 0408 11/23/17 0745  BP: 115/69 104/66  Pulse: 93 96  Resp: 20 14  Temp: 36.7 C 36.7 C  SpO2: 99% 100%    Last Pain:  Vitals:   11/23/17 0807  TempSrc:   PainSc: Asleep                 Lenard SimmerAndrew Brieanna Nau

## 2017-11-23 NOTE — Anesthesia Post-op Follow-up Note (Signed)
Anesthesia QCDR form completed.        

## 2017-11-23 NOTE — Op Note (Signed)
  Operative Note    Pre-Op Diagnosis:  1) incomplete spontaneous abortion 2) Uterine hemorrhage  3) acute blood loss anemia  Post-Op Diagnosis:  1) incomplete spontaneous abortion 2) Uterine hemorrhage 3) acute blood loss anemia  Procedures:  Suction, dilation and curettage  Primary Surgeon: Thomasene MohairStephen Jackson, MD   EBL: 100 mL   IVF: 400 mL crystalloid  Urine output: 25 mL clear urine at beginning of case  Specimens: products of conception  Drains: none  Complications: None   Disposition: PACU   Condition: Stable   Findings: moderate amount of products of conception  Procedure Summary:  After informed consent was confirmed, the patient was taken to the operating room where general anesthesia was induced.  Her legs were carefully placed in the candy cane stirrups.  She was prepped and draped in the standard fashion.  A time-out was performed.  A speculum was placed in the vagina.   A tenaculum was placed on the anterior lip of the cervix.  The cervix was serially dilated to 8 mm using Hegar dilators.  The 7 mm suction flexible curette was advanced to the uterine fundus.  Three passes were made with the suction curette with evacuation of adequate tissue noted.  Two passes were made using sharp curettage until a gritty texture was noted.  One final pass was made with the suction curette.  All instruments were removed from the vagina and the cervix was noted to be hemostatic after removal of the tenaculum with the utilization of silver nitrate.    The patient tolerated the procedure well.  Sponge, lap, needle, and instrument counts were correct x 2.  VTE prophylaxis: SCDs. Antibiotic prophylaxis: doxycycline 200 mg IV was given intraoperatively (the pharmacy was unable to get the medication to the OR prior to the start of the surgery). She was awakened in the operating room and was taken to the PACU in stable condition.   Thomasene MohairStephen Jackson, MD 11/23/2017 1:11 AM

## 2017-11-23 NOTE — Progress Notes (Signed)
Patient discharged home with significant other. Discharge instructions and prescriptions given and reviewed with patient. Patient verbalized understanding. Will be escorted out by auxillary.

## 2017-11-23 NOTE — ED Notes (Signed)
Report called to brandy rn or nurse 

## 2017-11-23 NOTE — Anesthesia Preprocedure Evaluation (Signed)
Anesthesia Evaluation  Patient identified by MRN, date of birth, ID band Patient awake    Reviewed: Allergy & Precautions, H&P , NPO status , Patient's Chart, lab work & pertinent test results, reviewed documented beta blocker date and time   History of Anesthesia Complications Negative for: history of anesthetic complications  Airway Mallampati: II  TM Distance: >3 FB Neck ROM: full    Dental  (+) Dental Advidsory Given, Chipped, Teeth Intact   Pulmonary neg pulmonary ROS,           Cardiovascular Exercise Tolerance: Good negative cardio ROS       Neuro/Psych negative neurological ROS  negative psych ROS   GI/Hepatic negative GI ROS, Neg liver ROS,   Endo/Other  negative endocrine ROS  Renal/GU negative Renal ROS  negative genitourinary   Musculoskeletal   Abdominal   Peds  Hematology  (+) Blood dyscrasia, anemia ,   Anesthesia Other Findings Past Medical History: No date: No known health problems   Reproductive/Obstetrics (+) Pregnancy                             Anesthesia Physical Anesthesia Plan  ASA: II  Anesthesia Plan: General   Post-op Pain Management:    Induction: Intravenous, Rapid sequence and Cricoid pressure planned  PONV Risk Score and Plan: 3 and Ondansetron, Dexamethasone, Midazolam and Treatment may vary due to age or medical condition  Airway Management Planned: Oral ETT  Additional Equipment:   Intra-op Plan:   Post-operative Plan: Extubation in OR  Informed Consent: I have reviewed the patients History and Physical, chart, labs and discussed the procedure including the risks, benefits and alternatives for the proposed anesthesia with the patient or authorized representative who has indicated his/her understanding and acceptance.   Dental Advisory Given  Plan Discussed with: Anesthesiologist, CRNA and Surgeon  Anesthesia Plan Comments:          Anesthesia Quick Evaluation

## 2017-11-23 NOTE — Anesthesia Procedure Notes (Signed)
Procedure Name: Intubation Date/Time: 11/23/2017 12:34 AM Performed by: Waldo LaineJustis, Wanda Alen, CRNA Pre-anesthesia Checklist: Patient identified, Patient being monitored, Timeout performed, Emergency Drugs available and Suction available Patient Re-evaluated:Patient Re-evaluated prior to induction Oxygen Delivery Method: Circle system utilized Preoxygenation: Pre-oxygenation with 100% oxygen Induction Type: IV induction, Rapid sequence and Cricoid Pressure applied Laryngoscope Size: Miller and 2 Grade View: Grade I Tube type: Oral Tube size: 7.0 mm Number of attempts: 1 Airway Equipment and Method: Stylet Placement Confirmation: ETT inserted through vocal cords under direct vision,  positive ETCO2 and breath sounds checked- equal and bilateral Secured at: 20 cm Tube secured with: Tape Dental Injury: Teeth and Oropharynx as per pre-operative assessment

## 2017-11-24 LAB — SURGICAL PATHOLOGY

## 2017-11-30 NOTE — Telephone Encounter (Signed)
OK to write letter for work. thx

## 2017-12-08 ENCOUNTER — Ambulatory Visit: Payer: Self-pay | Admitting: Obstetrics and Gynecology

## 2018-02-05 ENCOUNTER — Other Ambulatory Visit: Payer: Self-pay

## 2018-02-05 ENCOUNTER — Emergency Department (HOSPITAL_COMMUNITY): Payer: No Typology Code available for payment source

## 2018-02-05 ENCOUNTER — Emergency Department (HOSPITAL_COMMUNITY)
Admission: EM | Admit: 2018-02-05 | Discharge: 2018-02-05 | Disposition: A | Discharge status: Home / Self Care | Payer: No Typology Code available for payment source | Attending: Emergency Medicine | Admitting: Emergency Medicine

## 2018-02-05 ENCOUNTER — Encounter (HOSPITAL_COMMUNITY): Payer: Self-pay | Admitting: Emergency Medicine

## 2018-02-05 DIAGNOSIS — O210 Mild hyperemesis gravidarum: Secondary | ICD-10-CM | POA: Insufficient documentation

## 2018-02-05 DIAGNOSIS — Z3A01 Less than 8 weeks gestation of pregnancy: Secondary | ICD-10-CM | POA: Insufficient documentation

## 2018-02-05 DIAGNOSIS — R197 Diarrhea, unspecified: Secondary | ICD-10-CM | POA: Diagnosis not present

## 2018-02-05 DIAGNOSIS — O26891 Other specified pregnancy related conditions, first trimester: Secondary | ICD-10-CM | POA: Insufficient documentation

## 2018-02-05 DIAGNOSIS — R112 Nausea with vomiting, unspecified: Secondary | ICD-10-CM

## 2018-02-05 HISTORY — DX: Complete or unspecified spontaneous abortion without complication: O03.9

## 2018-02-05 LAB — URINALYSIS, ROUTINE W REFLEX MICROSCOPIC
BILIRUBIN URINE: NEGATIVE
Bacteria, UA: NONE SEEN
GLUCOSE, UA: NEGATIVE mg/dL
Hgb urine dipstick: NEGATIVE
KETONES UR: 80 mg/dL — AB
LEUKOCYTES UA: NEGATIVE
Nitrite: NEGATIVE
PH: 5 (ref 5.0–8.0)
PROTEIN: 100 mg/dL — AB
Specific Gravity, Urine: 1.034 — ABNORMAL HIGH (ref 1.005–1.030)

## 2018-02-05 LAB — COMPREHENSIVE METABOLIC PANEL
ALBUMIN: 3.9 g/dL (ref 3.5–5.0)
ALT: 26 U/L (ref 0–44)
ANION GAP: 8 (ref 5–15)
AST: 26 U/L (ref 15–41)
Alkaline Phosphatase: 80 U/L (ref 38–126)
BUN: 10 mg/dL (ref 6–20)
CO2: 22 mmol/L (ref 22–32)
Calcium: 9.4 mg/dL (ref 8.9–10.3)
Chloride: 107 mmol/L (ref 98–111)
Creatinine, Ser: 0.87 mg/dL (ref 0.44–1.00)
GFR calc non Af Amer: >60 mL/min (ref >60)
GLUCOSE: 88 mg/dL (ref 70–99)
POTASSIUM: 3.7 mmol/L (ref 3.5–5.1)
Sodium: 137 mmol/L (ref 135–145)
Total Bilirubin: 0.6 mg/dL (ref 0.3–1.2)
Total Protein: 7.6 g/dL (ref 6.5–8.1)

## 2018-02-05 LAB — HCG, QUANTITATIVE, PREGNANCY: hCG, Beta Chain, Quant, S: 122441 m[IU]/mL — ABNORMAL HIGH (ref <5)

## 2018-02-05 LAB — CBC
HCT: 38.8 % (ref 36.0–46.0)
Hemoglobin: 11.5 g/dL — ABNORMAL LOW (ref 12.0–15.0)
MCH: 24.2 pg — AB (ref 26.0–34.0)
MCHC: 29.6 g/dL — ABNORMAL LOW (ref 30.0–36.0)
MCV: 81.5 fL (ref 80.0–100.0)
NRBC: 0 % (ref 0.0–0.2)
PLATELETS: 393 10*3/uL (ref 150–400)
RBC: 4.76 MIL/uL (ref 3.87–5.11)
RDW: 13.9 % (ref 11.5–15.5)
WBC: 9.6 10*3/uL (ref 4.0–10.5)

## 2018-02-05 LAB — I-STAT BETA HCG BLOOD, ED (MC, WL, AP ONLY): I-stat hCG, quantitative: >2000 m[IU]/mL — ABNORMAL HIGH (ref <5)

## 2018-02-05 MED ORDER — SODIUM CHLORIDE 0.9 % IV BOLUS (SEPSIS)
1000.0000 mL | Freq: Once | INTRAVENOUS | Status: AC
  Administered 2018-02-05: 1000 mL via INTRAVENOUS

## 2018-02-05 MED ORDER — LOPERAMIDE HCL 2 MG PO CAPS
2.0000 mg | ORAL_CAPSULE | Freq: Once | ORAL | Status: AC
  Administered 2018-02-05: 2 mg via ORAL
  Filled 2018-02-05: qty 1

## 2018-02-05 MED ORDER — METOCLOPRAMIDE HCL 10 MG PO TABS: 10.0000 mg | ORAL_TABLET | Freq: Four times a day (QID) | ORAL | 0 refills | Status: DC | PRN

## 2018-02-05 MED ORDER — METOCLOPRAMIDE HCL 5 MG/ML IJ SOLN
10.0000 mg | Freq: Once | INTRAMUSCULAR | Status: AC
  Administered 2018-02-05: 10 mg via INTRAVENOUS
  Filled 2018-02-05: qty 2

## 2018-02-05 NOTE — ED Provider Notes (Signed)
TIME SEEN: 3:15 AM  CHIEF COMPLAINT: Nausea and vomiting  HPI: Patient is a 22 year old female who is a G2 P0 A1 who had a miscarriage and D&C in August who presents to the emergency department with complaints of nausea, vomiting, diarrhea that have been intermittent over the past 3 weeks.  She has had a positive home pregnancy test.  She has not yet to establish care with an OB/GYN and has not yet had an ultrasound.  Denies any fever.  Has had some intermittent abdominal cramping but none currently.  No vaginal bleeding, discharge.  Not leaking any fluid.  Her last menstrual period was sometime the first week of September.  ROS: See HPI Constitutional: no fever  Eyes: no drainage  ENT: no runny nose   Cardiovascular:  no chest pain  Resp: no SOB  GI:  vomiting GU: no dysuria Integumentary: no rash  Allergy: no hives  Musculoskeletal: no leg swelling  Neurological: no slurred speech ROS otherwise negative  PAST MEDICAL HISTORY/PAST SURGICAL HISTORY:  Past Medical History:  Diagnosis Date  . Miscarriage   . No known health problems     MEDICATIONS:  Prior to Admission medications   Medication Sig Start Date End Date Taking? Authorizing Provider  HYDROcodone-acetaminophen (NORCO/VICODIN) 5-325 MG tablet Take 1 tablet by mouth every 6 (six) hours as needed (breakthrough pain). Patient not taking: Reported on 02/05/2018 11/23/17   Conard Novak, MD  ibuprofen (ADVIL,MOTRIN) 600 MG tablet Take 1 tablet (600 mg total) by mouth every 6 (six) hours as needed (mild pain). Patient not taking: Reported on 02/05/2018 11/23/17   Conard Novak, MD  Prenatal Vit-Fe Fumarate-FA (PRENATAL COMPLETE) 14-0.4 MG TABS Take 1 tablet by mouth daily. Patient not taking: Reported on 02/05/2018 11/06/17   Dietrich Pates, PA-C    ALLERGIES:  Allergies  Allergen Reactions  . Septra [Sulfamethoxazole-Trimethoprim]     SOCIAL HISTORY:  Social History   Tobacco Use  . Smoking status: Never Smoker   . Smokeless tobacco: Never Used  Substance Use Topics  . Alcohol use: Not Currently    FAMILY HISTORY: No family history on file.  EXAM: BP 137/85 (BP Location: Right Arm)   Pulse 96   Temp 97.7 F (36.5 C) (Oral)   Resp 18   Ht 5\' 3"  (1.6 m)   Wt 68 kg   LMP 12/05/2017 (Approximate)   SpO2 100%   Breastfeeding? Unknown   BMI 26.57 kg/m  CONSTITUTIONAL: Alert and oriented and responds appropriately to questions. Well-appearing; well-nourished HEAD: Normocephalic EYES: Conjunctivae clear, pupils appear equal, EOMI ENT: normal nose; moist mucous membranes NECK: Supple, no meningismus, no nuchal rigidity, no LAD  CARD: RRR; S1 and S2 appreciated; no murmurs, no clicks, no rubs, no gallops RESP: Normal chest excursion without splinting or tachypnea; breath sounds clear and equal bilaterally; no wheezes, no rhonchi, no rales, no hypoxia or respiratory distress, speaking full sentences ABD/GI: Normal bowel sounds; non-distended; soft, non-tender, no rebound, no guarding, no peritoneal signs, no hepatosplenomegaly BACK:  The back appears normal and is non-tender to palpation, there is no CVA tenderness EXT: Normal ROM in all joints; non-tender to palpation; no edema; normal capillary refill; no cyanosis, no calf tenderness or swelling    SKIN: Normal color for age and race; warm; no rash NEURO: Moves all extremities equally PSYCH: The patient's mood and manner are appropriate. Grooming and personal hygiene are appropriate.  MEDICAL DECISION MAKING: Patient here with nausea, vomiting, intermittent diarrhea.  Abdominal exam is benign.  Suspect hyperemesis gravidarum versus viral illness.  Doubt appendicitis, colitis, bowel obstruction, cholecystitis, pancreatitis given benign exam.  Given she has had some intermittent abdominal cramping, will obtain transvaginal ultrasound.  Labs, urine pending.  Will give IV Reglan, IV fluids, Imodium for symptomatic relief.  ED PROGRESS: Labs  unremarkable.  Urine shows large ketones.  She also has some proteinuria.  Suspect this is from dehydration.  Her blood pressure is normal.  Will give second liter of IV fluids.  Transvaginal ultrasound shows normal single intrauterine pregnancy with heart rate of 173 with no acute abnormality.  Pregnancy is measuring 8 weeks and 0 days with estimated due date of June 13.  6:30 AM  Pt reports feeling much better.  Able to drink without difficulty.  Nausea has resolved.  Will discharge with prescription of Reglan.  Recommend over-the-counter Imodium as needed for diarrhea.  She will follow-up with an OB/GYN in [place].  Discussed return precautions.  Encourage increase fluid intake at home.  She verbalized understanding and is comfortable with this plan.   At this time, I do not feel there is any life-threatening condition present. I have reviewed and discussed all results (EKG, imaging, lab, urine as appropriate) and exam findings with patient/family. I have reviewed nursing notes and appropriate previous records.  I feel the patient is safe to be discharged home without further emergent workup and can continue workup as an outpatient as needed. Discussed usual and customary return precautions. Patient/family verbalize understanding and are comfortable with this plan.  Outpatient follow-up has been provided if needed. All questions have been answered.      Lenia Housley, Layla Maw, DO 02/05/18 [phone]

## 2018-02-05 NOTE — ED Notes (Signed)
Patient transported to Ultrasound 

## 2018-02-05 NOTE — Discharge Instructions (Addendum)
You may use Reglan as needed for nausea and vomiting.  You may use over-the-counter Imodium as needed for diarrhea.  You should drink 60 to 80 ounces of water a day.  If you begin vomiting and cannot stop, please return to the hospital.  Your ultrasound today showed a single pregnancy in the uterus that measures approximately 8 weeks with a heartbeat of 173 bpm.  Your due date is projected to be September 17, 2018.

## 2018-02-05 NOTE — ED Notes (Signed)
Pt verbalized understanding of dc papers, vss, ambulatory with nad upon discharge

## 2018-02-05 NOTE — ED Triage Notes (Signed)
C/o nausea and vomiting x 2 weeks.  Reports + home pregnancy test.  Miscarriage in August.  Seen at [facility] and given nausea medication that isn't helping.  States she has been unable to keep liquids down for 2 weeks and has lost weight.

## 2018-02-05 NOTE — ED Notes (Signed)
ED Provider at bedside. 

## 2018-02-08 ENCOUNTER — Encounter: Payer: No Typology Code available for payment source | Admitting: Obstetrics & Gynecology

## 2018-02-18 ENCOUNTER — Other Ambulatory Visit: Payer: Self-pay | Admitting: Obstetrics and Gynecology

## 2018-02-18 DIAGNOSIS — Z369 Encounter for antenatal screening, unspecified: Secondary | ICD-10-CM

## 2018-02-20 ENCOUNTER — Encounter: Payer: Self-pay | Admitting: Emergency Medicine

## 2018-02-20 ENCOUNTER — Emergency Department (HOSPITAL_COMMUNITY)
Admission: EM | Admit: 2018-02-20 | Discharge: 2018-02-21 | Disposition: A | Discharge status: Home / Self Care | Payer: Medicaid Other | Attending: Emergency Medicine | Admitting: Emergency Medicine

## 2018-02-20 DIAGNOSIS — Z3A09 9 weeks gestation of pregnancy: Secondary | ICD-10-CM | POA: Insufficient documentation

## 2018-02-20 DIAGNOSIS — O219 Vomiting of pregnancy, unspecified: Secondary | ICD-10-CM | POA: Insufficient documentation

## 2018-02-20 DIAGNOSIS — Z79899 Other long term (current) drug therapy: Secondary | ICD-10-CM | POA: Diagnosis not present

## 2018-02-20 MED ORDER — DIPHENHYDRAMINE HCL 50 MG/ML IJ SOLN
25.0000 mg | Freq: Once | INTRAMUSCULAR | Status: AC
  Administered 2018-02-21: 25 mg via INTRAVENOUS
  Filled 2018-02-20: qty 1

## 2018-02-20 MED ORDER — SODIUM CHLORIDE 0.9 % IV BOLUS
1000.0000 mL | Freq: Once | INTRAVENOUS | Status: AC
  Administered 2018-02-21: 1000 mL via INTRAVENOUS

## 2018-02-20 MED ORDER — METOCLOPRAMIDE HCL 5 MG/ML IJ SOLN
10.0000 mg | Freq: Once | INTRAMUSCULAR | Status: AC
  Administered 2018-02-21: 10 mg via INTRAVENOUS
  Filled 2018-02-20: qty 2

## 2018-02-20 NOTE — ED Provider Notes (Signed)
[facility] EMERGENCY DEPARTMENT Provider Note   CSN: [id number] Arrival date & time: 02/20/18  2337     History   Chief Complaint Chief Complaint  Patient presents with  . Emesis    HPI Wanda Erickson is a 22 y.o. female.  Patient presents to the emergency department for evaluation of nausea and vomiting.  Patient reports that she is 9+ weeks pregnant.  She has had problems with hyperemesis gravidarum during this pregnancy.  She reports that she was doing well when she was on Diclegis and Reglan, but has run out and has not been able to get these refilled.  She tried over-the-counter Unisom and vitamin B6, and it did not help.  Patient reports occasional twinges of pain in the lower abdomen but nothing continuous.  No bleeding or discharge.  Denies urinary symptoms.  Has not had a fever.  No diarrhea or upper respiratory symptoms.     Past Medical History:  Diagnosis Date  . Miscarriage   . No known health problems     Patient Active Problem List   Diagnosis Date Noted  . Incomplete abortion 11/23/2017  . Anemia due to acute blood loss 11/23/2017  . Uterine hemorrhage 11/23/2017  . Incomplete abortion with delayed or excessive hemorrhage 11/23/2017    Past Surgical History:  Procedure Laterality Date  . DILATION AND EVACUATION N/A 11/23/2017   Procedure: DILATATION AND EVACUATION;  Surgeon: Conard Novak, MD;  Location: [facility] ORS;  Service: Gynecology;  Laterality: N/A;  . NO PAST SURGERIES       OB History    Gravida  1   Para      Term      Preterm      AB      Living        SAB      TAB      Ectopic      Multiple  1   Live Births               Home Medications    Prior to Admission medications   Medication Sig Start Date End Date Taking? Authorizing Provider  doxylamine, Sleep, (UNISOM) 25 MG tablet Take 25 mg by mouth at bedtime as needed for sleep.   Yes [provider]  Pyridoxine HCl (VITAMIN B-6  PO) Take 1 tablet by mouth daily.   Yes [provider]  Doxylamine-Pyridoxine (DICLEGIS) 10-10 MG TBEC Take 2 tablets by mouth 2 (two) times daily. 02/21/18   Gilda Crease, MD  metoCLOPramide (REGLAN) 10 MG tablet Take 1 tablet (10 mg total) by mouth every 8 (eight) hours as needed for nausea or vomiting. 02/21/18   Gilda Crease, MD    Family History No family history on file.  Social History Social History   Tobacco Use  . Smoking status: Never Smoker  . Smokeless tobacco: Never Used  Substance Use Topics  . Alcohol use: Not Currently  . Drug use: Never     Allergies   Septra [sulfamethoxazole-trimethoprim]   Review of Systems Review of Systems  Gastrointestinal: Positive for nausea and vomiting.  All other systems reviewed and are negative.    Physical Exam Updated Vital Signs BP 109/69   Pulse 81   Temp 97.9 F (36.6 C) (Oral)   Resp 18   LMP 09/19/2017 (Exact Date)   SpO2 99%   Physical Exam  Constitutional: She is oriented to person, place, and time. She appears well-developed and well-nourished.  No distress.  HENT:  Head: Normocephalic and atraumatic.  Right Ear: Hearing normal.  Left Ear: Hearing normal.  Nose: Nose normal.  Mouth/Throat: Oropharynx is clear and moist and mucous membranes are normal.  Eyes: Pupils are equal, round, and reactive to light. Conjunctivae and EOM are normal.  Neck: Normal range of motion. Neck supple.  Cardiovascular: Regular rhythm, S1 normal and S2 normal. Exam reveals no gallop and no friction rub.  No murmur heard. Pulmonary/Chest: Effort normal and breath sounds normal. No respiratory distress. She exhibits no tenderness.  Abdominal: Soft. Normal appearance and bowel sounds are normal. There is no hepatosplenomegaly. There is no tenderness. There is no rebound, no guarding, no tenderness at McBurney's point and negative Murphy's sign. No hernia.  Musculoskeletal: Normal range of motion.    Neurological: She is alert and oriented to person, place, and time. She has normal strength. No cranial nerve deficit or sensory deficit. Coordination normal. GCS eye subscore is 4. GCS verbal subscore is 5. GCS motor subscore is 6.  Skin: Skin is warm, dry and intact. No rash noted. No cyanosis.  Psychiatric: She has a normal mood and affect. Her speech is normal and behavior is normal. Thought content normal.  Nursing note and vitals reviewed.    ED Treatments / Results  Labs (all labs ordered are listed, but only abnormal results are displayed) Labs Reviewed  CBC WITH DIFFERENTIAL/PLATELET - Abnormal; Notable for the following components:      Result Value   Hemoglobin 10.9 (*)    MCV 79.8 (*)    MCH 23.4 (*)    MCHC 29.3 (*)    All other components within normal limits  COMPREHENSIVE METABOLIC PANEL - Abnormal; Notable for the following components:   Sodium 128 (*)    Potassium 3.2 (*)    CO2 17 (*)    Glucose, Bld 121 (*)    Calcium 8.5 (*)    Albumin 3.3 (*)    All other components within normal limits  URINALYSIS, ROUTINE W REFLEX MICROSCOPIC - Abnormal; Notable for the following components:   APPearance HAZY (*)    Ketones, ur 5 (*)    All other components within normal limits    EKG None  Radiology No results found.  Procedures Procedures (including critical care time)  Medications Ordered in ED Medications  sodium chloride 0.9 % bolus 1,000 mL (0 mLs Intravenous Stopped 02/21/18 0205)    Followed by  sodium chloride 0.9 % bolus 1,000 mL (0 mLs Intravenous Stopped 02/21/18 0206)  metoCLOPramide (REGLAN) injection 10 mg (10 mg Intravenous Given 02/21/18 0002)  diphenhydrAMINE (BENADRYL) injection 25 mg (25 mg Intravenous Given 02/21/18 0001)  ondansetron (ZOFRAN) injection 4 mg (4 mg Intravenous Given 02/21/18 0210)     Initial Impression / Assessment and Plan / ED Course  I have reviewed the triage vital signs and the nursing notes.  Pertinent labs &  imaging results that were available during my care of the patient were reviewed by me and considered in my medical decision making (see chart for details).     Patient presented to the ER for evaluation for nausea and vomiting in early pregnancy.  This has been an ongoing problem with this pregnancy.  She has not experienced any significant abdominal pain.  Abdominal exam is benign, nontender.  No vaginal bleeding or discharge.  She has had a single intrauterine pregnancy confirmed by ultrasound previously.  Patient's nausea and vomiting has been exacerbated by running out of her meds.  Patient given IV fluids, Reglan, Zofran here in the ER.  She has had significant improvement.  Labs unremarkable.  Will refill Diclegis and reglan, follow up OB-GYN.  Final Clinical Impressions(s) / ED Diagnoses   Final diagnoses:  Nausea and vomiting in pregnancy    ED Discharge Orders         Ordered    Doxylamine-Pyridoxine (DICLEGIS) 10-10 MG TBEC  2 times daily     02/21/18 0341    metoCLOPramide (REGLAN) 10 MG tablet  Every 8 hours PRN     02/21/18 0341           Gilda CreasePollina, Christopher J, MD 02/21/18 [phone]

## 2018-02-20 NOTE — ED Triage Notes (Signed)
Pt comes for vomiting that has been going on for a while, pt is [redacted] weeks pregnant. Denies dysuria or urinary symptoms, denies diarrhea, fevers.

## 2018-02-21 LAB — CBC WITH DIFFERENTIAL/PLATELET
Abs Immature Granulocytes: 0.02 10*3/uL (ref 0.00–0.07)
BASOS PCT: 0 %
Basophils Absolute: 0 10*3/uL (ref 0.0–0.1)
EOS ABS: 0.1 10*3/uL (ref 0.0–0.5)
Eosinophils Relative: 1 %
HCT: 37.2 % (ref 36.0–46.0)
Hemoglobin: 10.9 g/dL — ABNORMAL LOW (ref 12.0–15.0)
Immature Granulocytes: 0 %
LYMPHS PCT: 35 %
Lymphs Abs: 3.4 10*3/uL (ref 0.7–4.0)
MCH: 23.4 pg — AB (ref 26.0–34.0)
MCHC: 29.3 g/dL — ABNORMAL LOW (ref 30.0–36.0)
MCV: 79.8 fL — AB (ref 80.0–100.0)
MONO ABS: 0.4 10*3/uL (ref 0.1–1.0)
MONOS PCT: 4 %
NEUTROS ABS: 5.7 10*3/uL (ref 1.7–7.7)
NEUTROS PCT: 60 %
PLATELETS: 382 10*3/uL (ref 150–400)
RBC: 4.66 MIL/uL (ref 3.87–5.11)
RDW: 14.6 % (ref 11.5–15.5)
WBC: 9.6 10*3/uL (ref 4.0–10.5)
nRBC: 0 % (ref 0.0–0.2)

## 2018-02-21 LAB — COMPREHENSIVE METABOLIC PANEL
ALT: 13 U/L (ref 0–44)
ANION GAP: 9 (ref 5–15)
AST: 17 U/L (ref 15–41)
Albumin: 3.3 g/dL — ABNORMAL LOW (ref 3.5–5.0)
Alkaline Phosphatase: 66 U/L (ref 38–126)
BUN: 6 mg/dL (ref 6–20)
CHLORIDE: 102 mmol/L (ref 98–111)
CO2: 17 mmol/L — ABNORMAL LOW (ref 22–32)
Calcium: 8.5 mg/dL — ABNORMAL LOW (ref 8.9–10.3)
Creatinine, Ser: 0.73 mg/dL (ref 0.44–1.00)
Glucose, Bld: 121 mg/dL — ABNORMAL HIGH (ref 70–99)
POTASSIUM: 3.2 mmol/L — AB (ref 3.5–5.1)
SODIUM: 128 mmol/L — AB (ref 135–145)
Total Bilirubin: 0.4 mg/dL (ref 0.3–1.2)
Total Protein: 7 g/dL (ref 6.5–8.1)

## 2018-02-21 LAB — URINALYSIS, ROUTINE W REFLEX MICROSCOPIC
BILIRUBIN URINE: NEGATIVE
Glucose, UA: NEGATIVE mg/dL
Hgb urine dipstick: NEGATIVE
Ketones, ur: 5 mg/dL — AB
LEUKOCYTES UA: NEGATIVE
NITRITE: NEGATIVE
Protein, ur: NEGATIVE mg/dL
SPECIFIC GRAVITY, URINE: 1.024 (ref 1.005–1.030)
pH: 6 (ref 5.0–8.0)

## 2018-02-21 MED ORDER — METOCLOPRAMIDE HCL 10 MG PO TABS: 10.0000 mg | ORAL_TABLET | Freq: Three times a day (TID) | ORAL | 3 refills | Status: DC | PRN

## 2018-02-21 MED ORDER — ONDANSETRON HCL 4 MG/2ML IJ SOLN
4.0000 mg | Freq: Once | INTRAMUSCULAR | Status: AC
  Administered 2018-02-21: 4 mg via INTRAVENOUS
  Filled 2018-02-21: qty 2

## 2018-02-21 MED ORDER — DOXYLAMINE-PYRIDOXINE 10-10 MG PO TBEC: 2.0000 | DELAYED_RELEASE_TABLET | Freq: Two times a day (BID) | ORAL | 3 refills | Status: DC

## 2018-03-04 ENCOUNTER — Emergency Department (HOSPITAL_COMMUNITY): Payer: Medicaid Other

## 2018-03-04 ENCOUNTER — Other Ambulatory Visit: Payer: Self-pay

## 2018-03-04 ENCOUNTER — Encounter (HOSPITAL_COMMUNITY): Payer: Self-pay | Admitting: Emergency Medicine

## 2018-03-04 ENCOUNTER — Emergency Department (HOSPITAL_COMMUNITY)
Admission: EM | Admit: 2018-03-04 | Discharge: 2018-03-04 | Disposition: A | Discharge status: Home / Self Care | Payer: Medicaid Other | Attending: Emergency Medicine | Admitting: Emergency Medicine

## 2018-03-04 DIAGNOSIS — R079 Chest pain, unspecified: Secondary | ICD-10-CM | POA: Diagnosis not present

## 2018-03-04 DIAGNOSIS — Z3A12 12 weeks gestation of pregnancy: Secondary | ICD-10-CM | POA: Insufficient documentation

## 2018-03-04 DIAGNOSIS — O99511 Diseases of the respiratory system complicating pregnancy, first trimester: Secondary | ICD-10-CM | POA: Diagnosis not present

## 2018-03-04 DIAGNOSIS — Z79899 Other long term (current) drug therapy: Secondary | ICD-10-CM | POA: Diagnosis not present

## 2018-03-04 DIAGNOSIS — O9989 Other specified diseases and conditions complicating pregnancy, childbirth and the puerperium: Secondary | ICD-10-CM | POA: Diagnosis present

## 2018-03-04 HISTORY — DX: Unspecified asthma, uncomplicated: J45.909

## 2018-03-04 LAB — CBC WITH DIFFERENTIAL/PLATELET
Abs Immature Granulocytes: 0.03 10*3/uL (ref 0.00–0.07)
Basophils Absolute: 0 10*3/uL (ref 0.0–0.1)
Basophils Relative: 0 %
Eosinophils Absolute: 0.1 10*3/uL (ref 0.0–0.5)
Eosinophils Relative: 1 %
HCT: 37.8 % (ref 36.0–46.0)
HEMOGLOBIN: 11.4 g/dL — AB (ref 12.0–15.0)
Immature Granulocytes: 0 %
Lymphocytes Relative: 37 %
Lymphs Abs: 4.4 10*3/uL — ABNORMAL HIGH (ref 0.7–4.0)
MCH: 24.4 pg — ABNORMAL LOW (ref 26.0–34.0)
MCHC: 30.2 g/dL (ref 30.0–36.0)
MCV: 80.8 fL (ref 80.0–100.0)
Monocytes Absolute: 0.5 10*3/uL (ref 0.1–1.0)
Monocytes Relative: 4 %
NEUTROS PCT: 58 %
Neutro Abs: 6.8 10*3/uL (ref 1.7–7.7)
Platelets: 339 10*3/uL (ref 150–400)
RBC: 4.68 MIL/uL (ref 3.87–5.11)
RDW: 15.9 % — ABNORMAL HIGH (ref 11.5–15.5)
WBC: 11.8 10*3/uL — ABNORMAL HIGH (ref 4.0–10.5)
nRBC: 0 % (ref 0.0–0.2)

## 2018-03-04 LAB — BASIC METABOLIC PANEL
Anion gap: 10 (ref 5–15)
BUN: 6 mg/dL (ref 6–20)
CHLORIDE: 107 mmol/L (ref 98–111)
CO2: 21 mmol/L — ABNORMAL LOW (ref 22–32)
Calcium: 9.1 mg/dL (ref 8.9–10.3)
Creatinine, Ser: 0.75 mg/dL (ref 0.44–1.00)
GFR calc Af Amer: >60 mL/min (ref >60)
GFR calc non Af Amer: >60 mL/min (ref >60)
Glucose, Bld: 99 mg/dL (ref 70–99)
Potassium: 3.4 mmol/L — ABNORMAL LOW (ref 3.5–5.1)
Sodium: 138 mmol/L (ref 135–145)

## 2018-03-04 LAB — I-STAT TROPONIN, ED: Troponin i, poc: 0 ng/mL (ref 0.00–0.08)

## 2018-03-04 MED ORDER — POTASSIUM CHLORIDE CRYS ER 20 MEQ PO TBCR
40.0000 meq | EXTENDED_RELEASE_TABLET | Freq: Once | ORAL | Status: AC
  Administered 2018-03-04: 40 meq via ORAL
  Filled 2018-03-04: qty 2

## 2018-03-04 MED ORDER — ACETAMINOPHEN 325 MG PO TABS
650.0000 mg | ORAL_TABLET | Freq: Once | ORAL | Status: AC
  Administered 2018-03-04: 650 mg via ORAL
  Filled 2018-03-04: qty 2

## 2018-03-04 NOTE — ED Notes (Signed)
Discharge instructions discussed with Pt. Pt verbalized understanding. Pt stable and ambulatory.    

## 2018-03-04 NOTE — ED Notes (Signed)
Patient transported to XR. 

## 2018-03-04 NOTE — Discharge Instructions (Addendum)
Develop severe worsening chest pain, shortness of breath, nausea, vomiting, fever, or any other new/concerning symptoms then return to the ER for evaluation.

## 2018-03-04 NOTE — ED Triage Notes (Signed)
Pt reports left arm pain that radiates to chest, wrist and fingers. Pt is [redacted] weeks pregnant.

## 2018-03-04 NOTE — ED Provider Notes (Addendum)
[facility] EMERGENCY DEPARTMENT Provider Note   CSN: [id number] Arrival date & time: 03/04/18  2100     History   Chief Complaint Chief Complaint  Patient presents with  . Chest Pain  . Arm Pain    HPI Wanda Erickson is a 22 y.o. female.  HPI  22 year old female who is currently about [redacted] weeks pregnant presents with chest pain.  Started about an hour prior to arrival.  It is left-sided, sharp, and radiates down her entire arm.  Now it is mostly her arm that is hurting.  She rates as an 8 out of 10.  She is been having chest pain and arm pain like this on and off for multiple years.  Usually happens once a month or so.  Typically only lasts an hour but tonight it is a little stronger and lasting longer than normal.  She states that she has seen a cardiologist back in [place] couple years ago who did not find any significant abnormalities.  She had an echocardiogram.  The pain is very similar to her multiple prior, chronic episodes, except at stronger and lasting longer.  No pleuritic pain, cough, hemoptysis, leg swelling.  Past Medical History:  Diagnosis Date  . Asthma    "As a kid"  . Miscarriage   . No known health problems     Patient Active Problem List   Diagnosis Date Noted  . Incomplete abortion 11/23/2017  . Anemia due to acute blood loss 11/23/2017  . Uterine hemorrhage 11/23/2017  . Incomplete abortion with delayed or excessive hemorrhage 11/23/2017    Past Surgical History:  Procedure Laterality Date  . DILATION AND EVACUATION N/A 11/23/2017   Procedure: DILATATION AND EVACUATION;  Surgeon: Conard Novak, MD;  Location: [facility] ORS;  Service: Gynecology;  Laterality: N/A;  . NO PAST SURGERIES       OB History    Gravida  1   Para      Term      Preterm      AB      Living        SAB      TAB      Ectopic      Multiple  1   Live Births               Home Medications    Prior to Admission medications     Medication Sig Start Date End Date Taking? Authorizing Provider  doxylamine, Sleep, (UNISOM) 25 MG tablet Take 25 mg by mouth at bedtime as needed for sleep.   Yes [provider]  metoCLOPramide (REGLAN) 10 MG tablet Take 1 tablet (10 mg total) by mouth every 8 (eight) hours as needed for nausea or vomiting. 02/21/18  Yes Pollina, Canary Brim, MD  Pyridoxine HCl (VITAMIN B-6 PO) Take 1 tablet by mouth daily.   Yes [provider]  Doxylamine-Pyridoxine (DICLEGIS) 10-10 MG TBEC Take 2 tablets by mouth 2 (two) times daily. Patient not taking: Reported on 03/04/2018 02/21/18   Gilda Crease, MD    Family History History reviewed. No pertinent family history.  Social History Social History   Tobacco Use  . Smoking status: Never Smoker  . Smokeless tobacco: Never Used  Substance Use Topics  . Alcohol use: Not Currently  . Drug use: Never     Allergies   Septra [sulfamethoxazole-trimethoprim]   Review of Systems Review of Systems  Constitutional: Negative for diaphoresis and fever.  Respiratory: Negative  for cough and shortness of breath.   Cardiovascular: Positive for chest pain. Negative for leg swelling.  Gastrointestinal: Negative for vomiting.  All other systems reviewed and are negative.    Physical Exam Updated Vital Signs BP 118/77   Pulse 88   Temp 98.2 F (36.8 C) (Oral)   Resp (!) 23   Ht 5\' 3"  (1.6 m)   Wt 72.6 kg   LMP 09/19/2017 (Exact Date)   SpO2 100%   BMI 28.34 kg/m   Physical Exam  Constitutional: She appears well-developed and well-nourished.  Non-toxic appearance. She does not appear ill. No distress.  HENT:  Head: Normocephalic and atraumatic.  Right Ear: External ear normal.  Left Ear: External ear normal.  Nose: Nose normal.  Eyes: Right eye exhibits no discharge. Left eye exhibits no discharge.  Cardiovascular: Normal rate, regular rhythm and normal heart sounds.  Pulses:      Radial pulses are 2+ on the  right side.  Pulmonary/Chest: Effort normal and breath sounds normal. She exhibits no tenderness.  Abdominal: Soft. There is no tenderness.  Musculoskeletal:       Right lower leg: She exhibits no edema.       Left lower leg: She exhibits no edema.  Neurological: She is alert.  Skin: Skin is warm and dry. She is not diaphoretic.  Psychiatric: Her mood appears not anxious.  Nursing note and vitals reviewed.    ED Treatments / Results  Labs (all labs ordered are listed, but only abnormal results are displayed) Labs Reviewed  BASIC METABOLIC PANEL - Abnormal; Notable for the following components:      Result Value   Potassium 3.4 (*)    CO2 21 (*)    All other components within normal limits  CBC WITH DIFFERENTIAL/PLATELET - Abnormal; Notable for the following components:   WBC 11.8 (*)    Hemoglobin 11.4 (*)    MCH 24.4 (*)    RDW 15.9 (*)    Lymphs Abs 4.4 (*)    All other components within normal limits  I-STAT TROPONIN, ED    EKG EKG Interpretation  Date/Time:  Friday March 04 2018 21:10:49 EST Ventricular Rate:  91 PR Interval:    QRS Duration: 90 QT Interval:  345 QTC Calculation: 425 R Axis:   55 Text Interpretation:  Sinus rhythm RSR' in V1 or V2, probably normal variant Baseline wander in lead(s) V3 V6 No old tracing to compare Confirmed by Pricilla LovelessGoldston, Arcangel Minion [phone]([id number]) on 03/04/2018 9:14:20 PM   Radiology Dg Chest 2 View  Result Date: 03/04/2018 CLINICAL DATA:  Initial evaluation for acute left-sided chest pain. EXAM: CHEST - 2 VIEW COMPARISON:  None. FINDINGS: The cardiac and mediastinal silhouettes are within normal limits. The lungs are normally inflated. No airspace consolidation, pleural effusion, or pulmonary edema is identified. There is no pneumothorax. No acute osseous abnormality identified. IMPRESSION: No active cardiopulmonary disease. Electronically Signed   By: Rise MuBenjamin  McClintock M.D.   On: 03/04/2018 21:55    Procedures Procedures (including  critical care time)  Medications Ordered in ED Medications  acetaminophen (TYLENOL) tablet 650 mg (650 mg Oral Given 03/04/18 2202)  potassium chloride SA (K-DUR,KLOR-CON) CR tablet 40 mEq (40 mEq Oral Given 03/04/18 2239)     Initial Impression / Assessment and Plan / ED Course  I have reviewed the triage vital signs and the nursing notes.  Pertinent labs & imaging results that were available during my care of the patient were reviewed by me and considered in  my medical decision making (see chart for details).     Patient is pretty low risk for cardiac disease or ACS.  ECG is without acute ischemic changes.  Her story is pretty atypical.  Given that she is been having this on and off for many years makes me also less suspicious of cardiac disease.  Given this I also highly doubt PE.  While she may be at slightly increased risk due to her pregnancy, she is just now finishing the first trimester and so I do not think her risk is that much more that when combined with a very low suspicion would need for testing.  I discussed her negative results, besides the mild hypokalemia which will be repleted.  We discussed that technically one troponin this early cannot 100% rule out heart attack but given the low suspicion I think is reasonable for her to be discharged.  She does not want to wait on a second.  We discussed the need to acquire a PCP and we discussed strict return precautions.  Follow-up with her OB/GYN as well.  No pregnancy complaints during this visit.  Final Clinical Impressions(s) / ED Diagnoses   Final diagnoses:  Nonspecific chest pain    ED Discharge Orders    None       Pricilla Loveless, MD 03/04/18 1610    Pricilla Loveless, MD 03/04/18 2257

## 2018-03-10 ENCOUNTER — Ambulatory Visit: Payer: No Typology Code available for payment source

## 2018-04-19 ENCOUNTER — Emergency Department (HOSPITAL_COMMUNITY)
Admission: EM | Admit: 2018-04-19 | Discharge: 2018-04-20 | Disposition: A | Discharge status: Home / Self Care | Payer: Medicaid Other | Attending: Emergency Medicine | Admitting: Emergency Medicine

## 2018-04-19 ENCOUNTER — Encounter (HOSPITAL_COMMUNITY): Payer: Self-pay

## 2018-04-19 DIAGNOSIS — O211 Hyperemesis gravidarum with metabolic disturbance: Secondary | ICD-10-CM | POA: Insufficient documentation

## 2018-04-19 DIAGNOSIS — O210 Mild hyperemesis gravidarum: Secondary | ICD-10-CM | POA: Diagnosis present

## 2018-04-19 DIAGNOSIS — Z79899 Other long term (current) drug therapy: Secondary | ICD-10-CM | POA: Diagnosis not present

## 2018-04-19 DIAGNOSIS — Z3A18 18 weeks gestation of pregnancy: Secondary | ICD-10-CM | POA: Insufficient documentation

## 2018-04-19 DIAGNOSIS — O99512 Diseases of the respiratory system complicating pregnancy, second trimester: Secondary | ICD-10-CM | POA: Diagnosis not present

## 2018-04-19 LAB — URINALYSIS, ROUTINE W REFLEX MICROSCOPIC
Bacteria, UA: NONE SEEN
Bilirubin Urine: NEGATIVE
Glucose, UA: NEGATIVE mg/dL
Hgb urine dipstick: NEGATIVE
Ketones, ur: 80 mg/dL — AB
Leukocytes, UA: NEGATIVE
Nitrite: NEGATIVE
Protein, ur: 30 mg/dL — AB
Specific Gravity, Urine: 1.025 (ref 1.005–1.030)
pH: 5 (ref 5.0–8.0)

## 2018-04-19 LAB — CBC
HCT: 37.4 % (ref 36.0–46.0)
HEMOGLOBIN: 11.4 g/dL — AB (ref 12.0–15.0)
MCH: 25 pg — ABNORMAL LOW (ref 26.0–34.0)
MCHC: 30.5 g/dL (ref 30.0–36.0)
MCV: 82 fL (ref 80.0–100.0)
Platelets: 369 10*3/uL (ref 150–400)
RBC: 4.56 MIL/uL (ref 3.87–5.11)
RDW: 19 % — ABNORMAL HIGH (ref 11.5–15.5)
WBC: 15.1 10*3/uL — ABNORMAL HIGH (ref 4.0–10.5)
nRBC: 0 % (ref 0.0–0.2)

## 2018-04-19 NOTE — ED Triage Notes (Signed)
Pt states that she is [redacted] weeks pregnant, been having nausea and vomiting for the past 4-5 days

## 2018-04-20 LAB — COMPREHENSIVE METABOLIC PANEL
ALT: 21 U/L (ref 0–44)
ANION GAP: 15 (ref 5–15)
AST: 18 U/L (ref 15–41)
Albumin: 3.2 g/dL — ABNORMAL LOW (ref 3.5–5.0)
Alkaline Phosphatase: 70 U/L (ref 38–126)
BUN: 8 mg/dL (ref 6–20)
CO2: 13 mmol/L — ABNORMAL LOW (ref 22–32)
Calcium: 9.2 mg/dL (ref 8.9–10.3)
Chloride: 108 mmol/L (ref 98–111)
Creatinine, Ser: 0.88 mg/dL (ref 0.44–1.00)
GFR calc Af Amer: >60 mL/min (ref >60)
GFR calc non Af Amer: >60 mL/min (ref >60)
Glucose, Bld: 73 mg/dL (ref 70–99)
Potassium: 4 mmol/L (ref 3.5–5.1)
Sodium: 136 mmol/L (ref 135–145)
TOTAL PROTEIN: 7.2 g/dL (ref 6.5–8.1)
Total Bilirubin: 0.9 mg/dL (ref 0.3–1.2)

## 2018-04-20 LAB — LIPASE, BLOOD: Lipase: 27 U/L (ref 11–51)

## 2018-04-20 MED ORDER — ONDANSETRON HCL 4 MG/2ML IJ SOLN
4.0000 mg | Freq: Once | INTRAMUSCULAR | Status: AC
  Administered 2018-04-20: 4 mg via INTRAVENOUS
  Filled 2018-04-20: qty 2

## 2018-04-20 MED ORDER — SODIUM CHLORIDE 0.9 % IV BOLUS
1000.0000 mL | Freq: Once | INTRAVENOUS | Status: AC
  Administered 2018-04-20: 1000 mL via INTRAVENOUS

## 2018-04-20 MED ORDER — ONDANSETRON 4 MG PO TBDP: 4.0000 mg | ORAL_TABLET | Freq: Three times a day (TID) | ORAL | 0 refills | Status: DC | PRN

## 2018-04-20 MED ORDER — PROMETHAZINE HCL 25 MG RE SUPP: 25.0000 mg | Freq: Four times a day (QID) | RECTAL | 0 refills | Status: DC | PRN

## 2018-04-20 NOTE — Discharge Instructions (Addendum)
Thank you for allowing me to care for you today in the Emergency Department.   Continue to follow-up with your OB/GYN.  For vomiting, you can let 1 tablet of Zofran dissolve in your tongue every 8 hours as needed.  If you are unable to get your vomiting to resolve with Zofran, you can insert 1 suppository of promethazine in the rectum every 6 hours as needed for nausea or vomiting.  Zofran is a much safer medication to use in pregnancy so only use promethazine if you continue to have severe vomiting.  There have been some studies that have shown that acupuncture wrist bands may also help to reduce nausea and vomiting in pregnancy.  They are available over-the-counter.  You may affect from following a bland diet with low-fat foods for the next few days.  Return to the emergency department if you stop making urine, if you pass out, if you have persistent vomiting despite taking Zofran or promethazine, or other new, concerning symptoms.

## 2018-04-20 NOTE — ED Provider Notes (Signed)
[facility] Provider Note   CSN: [id number] Arrival date & time: 04/19/18  2303     History   Chief Complaint Chief Complaint  Patient presents with  . Hyperemesis Gravidarum    HPI Wanda FootmanSabrena Erickson is a [id number] 23 y.o. female with a history of incomplete abortion who presents to the emergency department with a chief complaint of nausea and vomiting.  The patient reports persistent nonbloody, nonbilious emesis for the last 4 to 5 days with nausea.  She reports she has been having 1-2 episodes of nonbloody diarrhea for the last 2 days.  She is currently [redacted] weeks pregnant.  She reports she was diagnosed with hyperemesis gravidarum during her first trimester.  Until the current onset of symptoms, she has not had any vomiting for the last few weeks.  She denies of fever, chills, body aches, hematemesis, melena, hematochezia, headache, cough, abdominal pain, chest pain, shortness of breath, dysuria, vaginal discharge or bleeding.  She has been treating her symptoms at home with Reglan and Unisom and vitamin B6.  She reports the Reglan has been ineffective and the Unisom and vitamin B6 was initially helping her symptoms, but seems to have stopped working.  She reports that she has not been able to keep down any fluids or food without vomiting in almost a week.  The history is provided by the patient. No language interpreter was used.    Past Medical History:  Diagnosis Date  . Asthma    "As a kid"  . Miscarriage   . No known health problems     Patient Active Problem List   Diagnosis Date Noted  . Incomplete abortion 11/23/2017  . Anemia due to acute blood loss 11/23/2017  . Uterine hemorrhage 11/23/2017  . Incomplete abortion with delayed or excessive hemorrhage 11/23/2017    Past Surgical History:  Procedure Laterality Date  . DILATION AND EVACUATION N/A 11/23/2017   Procedure: DILATATION AND EVACUATION;  Surgeon: Conard NovakJackson, Stephen D, MD;   Location: [facility] ORS;  Service: Gynecology;  Laterality: N/A;  . NO PAST SURGERIES       OB History    Gravida  1   Para      Term      Preterm      AB      Living        SAB      TAB      Ectopic      Multiple  1   Live Births               Home Medications    Prior to Admission medications   Medication Sig Start Date End Date Taking? Authorizing Provider  doxylamine, Sleep, (UNISOM) 25 MG tablet Take 25 mg by mouth at bedtime as needed for sleep.    [provider]  Doxylamine-Pyridoxine (DICLEGIS) 10-10 MG TBEC Take 2 tablets by mouth 2 (two) times daily. Patient not taking: Reported on 03/04/2018 02/21/18   Gilda CreasePollina, Christopher J, MD  metoCLOPramide (REGLAN) 10 MG tablet Take 1 tablet (10 mg total) by mouth every 8 (eight) hours as needed for nausea or vomiting. 02/21/18   Gilda CreasePollina, Christopher J, MD  ondansetron (ZOFRAN ODT) 4 MG disintegrating tablet Take 1 tablet (4 mg total) by mouth every 8 (eight) hours as needed for nausea or vomiting. 04/20/18   Avalynne Diver A, PA-C  promethazine (PHENERGAN) 25 MG suppository Place 1 suppository (25 mg total) rectally every 6 (six) hours as needed  for nausea or vomiting. 04/20/18   Rockell Faulks A, PA-C  Pyridoxine HCl (VITAMIN B-6 PO) Take 1 tablet by mouth daily.    [provider]    Family History No family history on file.  Social History Social History   Tobacco Use  . Smoking status: Never Smoker  . Smokeless tobacco: Never Used  Substance Use Topics  . Alcohol use: Not Currently  . Drug use: Never     Allergies   Septra [sulfamethoxazole-trimethoprim]   Review of Systems Review of Systems  Constitutional: Negative for activity change, chills and fever.  HENT: Negative for congestion, ear discharge, sneezing and sore throat.   Eyes: Negative for visual disturbance.  Respiratory: Negative for shortness of breath.   Cardiovascular: Negative for chest pain.  Gastrointestinal:  Positive for diarrhea, nausea and vomiting. Negative for abdominal pain and constipation.  Genitourinary: Negative for dysuria, frequency, urgency, vaginal bleeding and vaginal discharge.  Musculoskeletal: Negative for back pain.  Skin: Negative for rash.  Allergic/Immunologic: Negative for immunocompromised state.  Neurological: Negative for seizures, weakness, numbness and headaches.  Psychiatric/Behavioral: Negative for confusion.     Physical Exam Updated Vital Signs BP 112/86   Pulse 98   Temp 98 F (36.7 C) (Oral)   Resp 16   LMP 09/19/2017 (Exact Date)   SpO2 99%   Physical Exam Vitals signs and nursing note reviewed.  Constitutional:      General: She is not in acute distress. HENT:     Head: Normocephalic.     Right Ear: Tympanic membrane normal.     Left Ear: Tympanic membrane normal.     Nose: No congestion or rhinorrhea.     Mouth/Throat:     Mouth: Mucous membranes are dry.     Pharynx: No oropharyngeal exudate or posterior oropharyngeal erythema.  Eyes:     Conjunctiva/sclera: Conjunctivae normal.  Neck:     Musculoskeletal: Normal range of motion and neck supple.  Cardiovascular:     Rate and Rhythm: Regular rhythm. Tachycardia present.     Heart sounds: No murmur. No friction rub. No gallop.   Pulmonary:     Effort: Pulmonary effort is normal. No respiratory distress.     Breath sounds: No stridor. No wheezing, rhonchi or rales.  Abdominal:     General: There is no distension.     Palpations: Abdomen is soft. There is no mass.     Tenderness: There is no abdominal tenderness. There is no right CVA tenderness, left CVA tenderness, guarding or rebound.     Hernia: No hernia is present.  Skin:    General: Skin is warm.     Capillary Refill: Capillary refill takes 2 to 3 seconds.     Coloration: Skin is pale.     Findings: No rash.  Neurological:     Mental Status: She is alert.  Psychiatric:        Behavior: Behavior normal.      ED Treatments  / Results  Labs (all labs ordered are listed, but only abnormal results are displayed) Labs Reviewed  COMPREHENSIVE METABOLIC PANEL - Abnormal; Notable for the following components:      Result Value   CO2 13 (*)    Albumin 3.2 (*)    All other components within normal limits  CBC - Abnormal; Notable for the following components:   WBC 15.1 (*)    Hemoglobin 11.4 (*)    MCH 25.0 (*)    RDW 19.0 (*)  All other components within normal limits  URINALYSIS, ROUTINE W REFLEX MICROSCOPIC - Abnormal; Notable for the following components:   APPearance HAZY (*)    Ketones, ur 80 (*)    Protein, ur 30 (*)    All other components within normal limits  LIPASE, BLOOD    EKG None  Radiology No results found.  Procedures Procedures (including critical care time)  Medications Ordered in ED Medications  sodium chloride 0.9 % bolus 1,000 mL (0 mLs Intravenous Stopped 04/20/18 0433)  ondansetron (ZOFRAN) injection 4 mg (4 mg Intravenous Given 04/20/18 0314)  sodium chloride 0.9 % bolus 1,000 mL (0 mLs Intravenous Stopped 04/20/18 0620)     Initial Impression / Assessment and Plan / ED Course  I have reviewed the triage vital signs and the nursing notes.  Pertinent labs & imaging results that were available during my care of the patient were reviewed by me and considered in my medical decision making (see chart for details).     Wanda FootmanSabrena Fiore is a [id number] female with a history of incomplete abortion presenting with nausea and vomiting for the last 4 to 5 days.  She has been unable to keep down any food or fluids at home.  On arrival to the ER, she is afebrile, but tachycardic.  She is normotensive and is not hypoxic.  On exam, she appears pallorous and clinically hypovolemic, but otherwise her exam is unremarkable.  Labs are notable for leukocytosis of 15.1, which I suspect is in part due to hemoconcentration; however, her hemoglobin has remained unchanged from previous at 11.4.  She  does not have any infectious symptoms.  Leukocytosis may be reactive.  Urine appears concentrated, but not infectious.  Her bicarb is 13, but otherwise her metabolic panel is unremarkable.  Will give Zofran and IV fluids and plan to fluid challenge the patient.  Patient recheck.  She is feeling much better after 2 L of IV fluids.  Nausea and vomiting have resolved.  She is tolerated to sodas in the ER without continued vomiting.  We will discharge the patient home with Zofran ODT.  She reports that she used to take [employer], but it is no longer covered by her insurance.  She has tried substituting it with Unisom and vitamin B6, but it has not been effective for her.  I will also send her home with a short course of Phenergan suppositories, but have instructed her to only use them as a last resort.  She has been given strict return precautions to the emergency department.  I have encouraged her to keep close follow-up with OB if her symptoms return.  She is hemodynamically stable and in no acute distress.  She is safe for discharge home with outpatient follow-up at this time.  Final Clinical Impressions(s) / ED Diagnoses   Final diagnoses:  Hyperemesis gravidarum before end of [redacted] week gestation, dehydration    ED Discharge Orders         Ordered    ondansetron (ZOFRAN ODT) 4 MG disintegrating tablet  Every 8 hours PRN     04/20/18 0706    promethazine (PHENERGAN) 25 MG suppository  Every 6 hours PRN     04/20/18 0706           Frederik PearMcDonald, Rael Yo A, PA-C 04/21/18 0820    Gilda CreasePollina, Christopher J, MD 04/28/18 [phone]

## 2018-09-18 ENCOUNTER — Encounter (HOSPITAL_COMMUNITY): Payer: Self-pay

## 2019-06-05 IMAGING — US US OB TRANSVAGINAL
1 series · 14 of 28 positions shown · non-contrast
Comparison: Pelvic ultrasound November 22, 2017 and November 18, 2017

CLINICAL DATA: Cramping.

EXAM:
OBSTETRIC <14 WK US AND TRANSVAGINAL OB US
TECHNIQUE: Both transabdominal and transvaginal ultrasound examinations were
performed for complete evaluation of the gestation as well as the
maternal uterus, adnexal regions, and pelvic cul-de-sac.
Transvaginal technique was performed to assess early pregnancy.

[Series 1: us ob transvaginal · 0.22mm/px · 65 acquisitions, 14 frames shown]
[im 3/65]
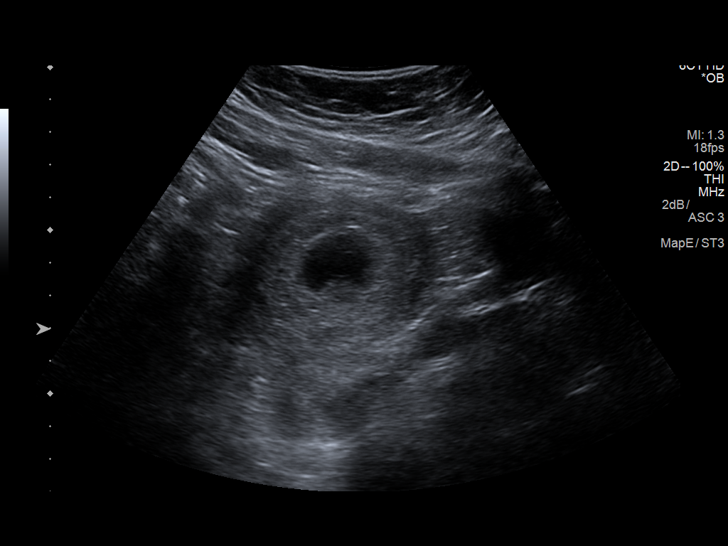
[im 8/65]
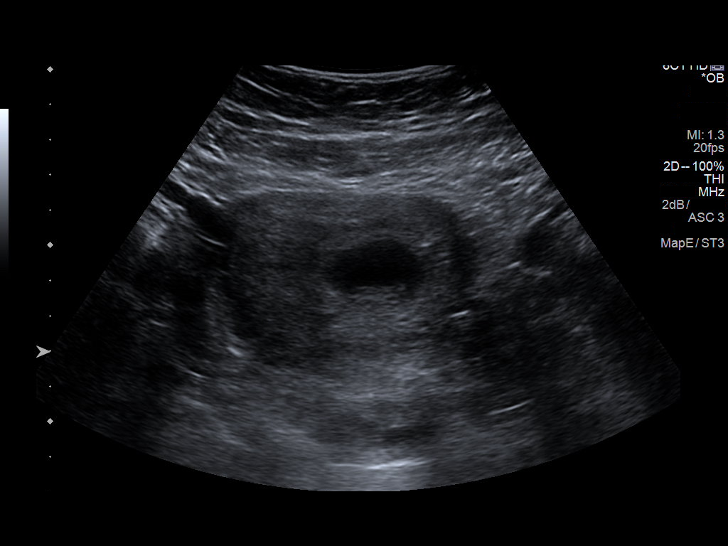
[im 12/65]
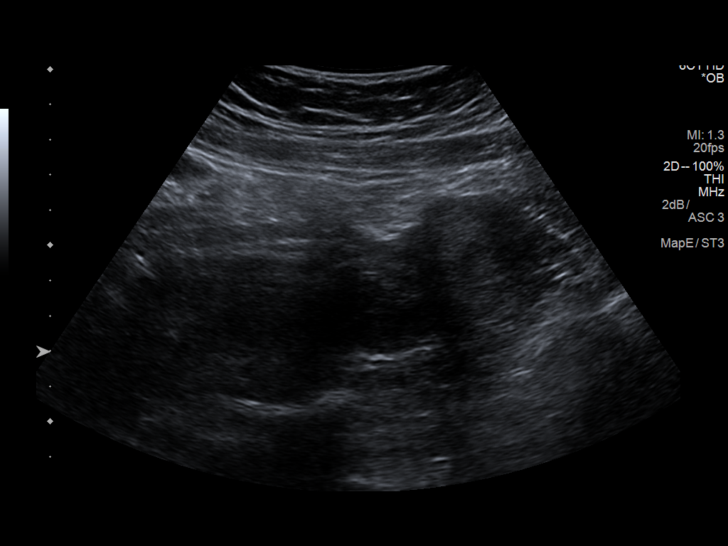
[im 17/65]
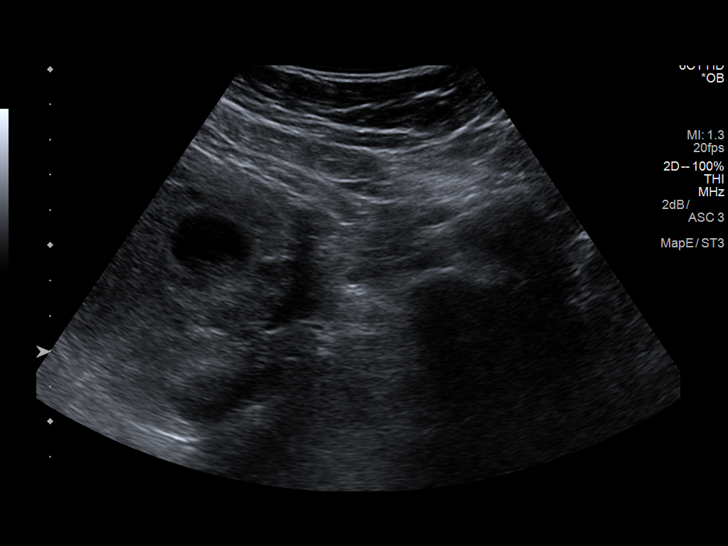
[im 22/65]
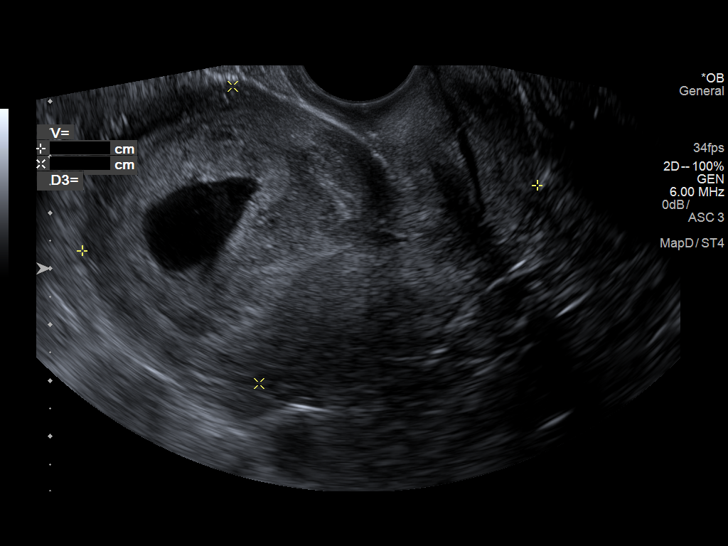
[im 27/65]
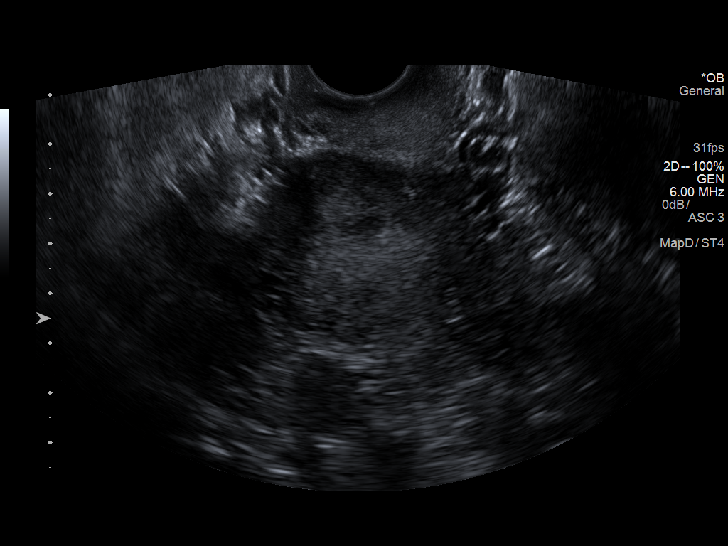
[im 31/65]
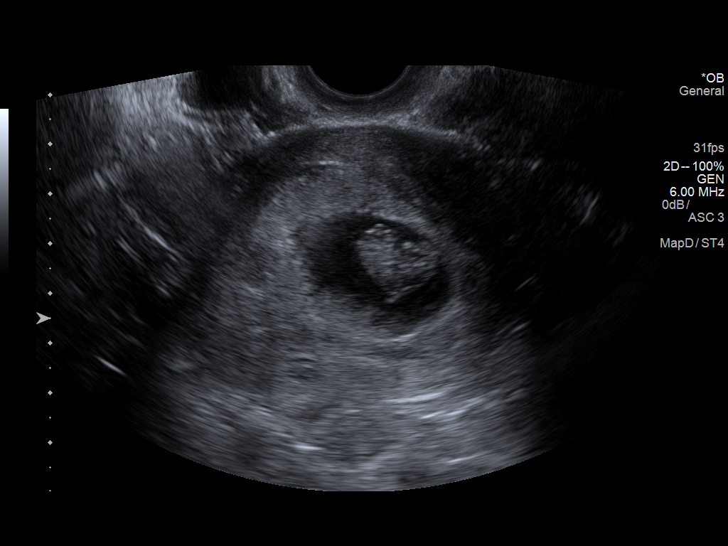
[im 36/65]
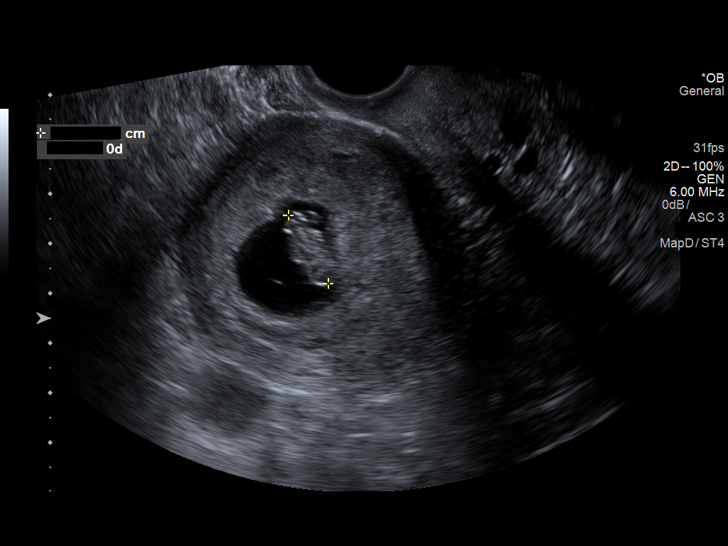
[im 41/65]
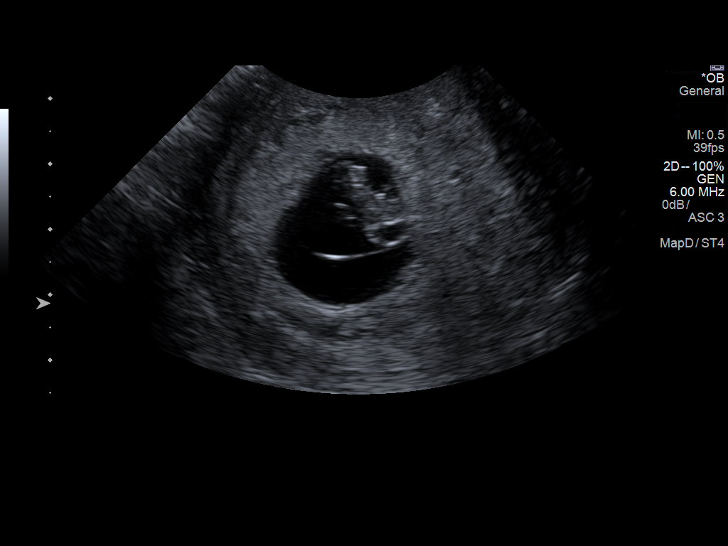
[im 46/65]
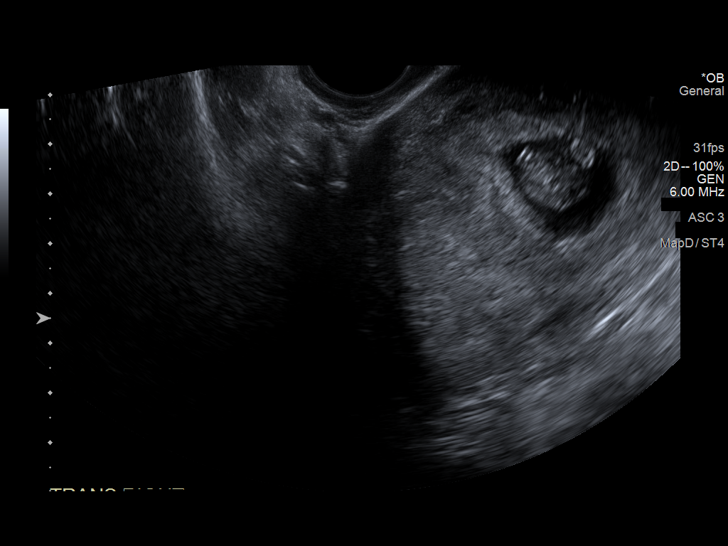
[im 50/65]
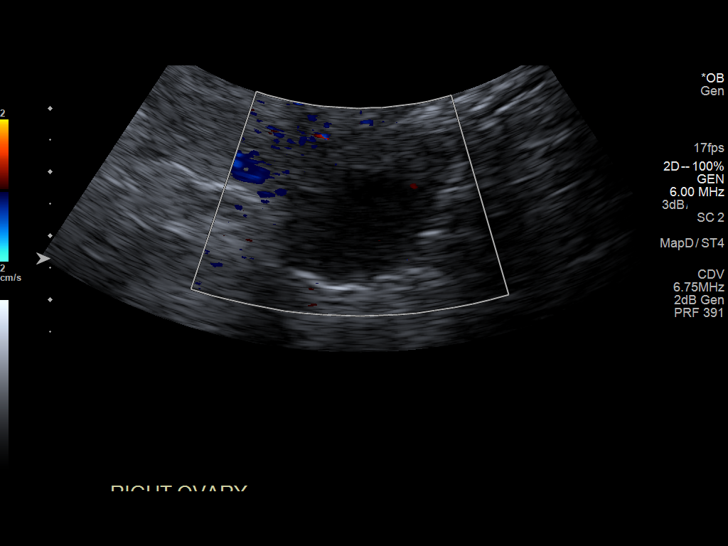
[im 55/65]
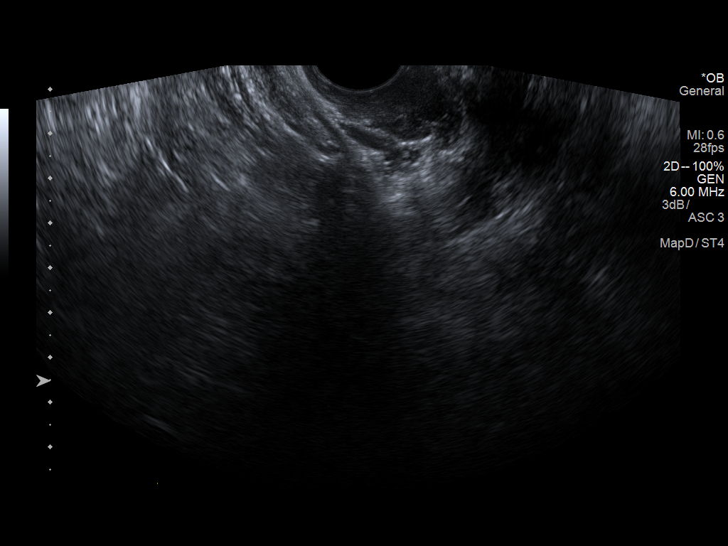
[im 60/65]
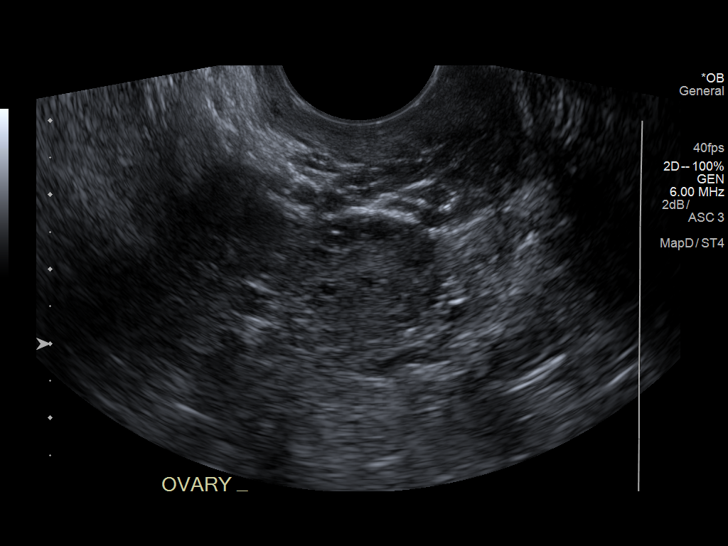
[im 65/65]
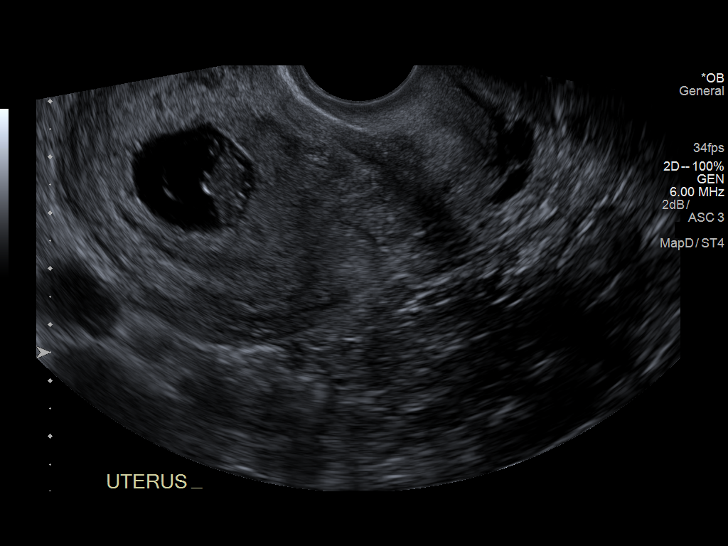

[14 of 28 positions shown; findings below may reference images not displayed]

FINDINGS: Intrauterine gestational sac: Present.

Yolk sac:  Present peer

Embryo:  Present.

Cardiac Activity: Present.

Heart Rate: 173 bpm

CRL:  16.4 mm   8 w   0 d                  US EDC: September 17, 2018

Subchorionic hemorrhage:  None visualized.

Maternal uterus/adnexae: Normal appearance of the adnexa. No free
fluid.
IMPRESSION: 1. Single live intrauterine pregnancy, gestational age by ultrasound
8 weeks and 0 days. No immediate complication.

## 2020-01-18 IMAGING — US US OB TRANSVAGINAL
1 series · 14 of 28 positions shown · non-contrast
Comparison: None.

CLINICAL DATA: Pregnant, spotting

EXAM:
OBSTETRIC <14 WK US AND TRANSVAGINAL OB US
TECHNIQUE: Both transabdominal and transvaginal ultrasound examinations were
performed for complete evaluation of the gestation as well as the
maternal uterus, adnexal regions, and pelvic cul-de-sac.
Transvaginal technique was performed to assess early pregnancy.

[Series 1: us ob transvaginal · 14 of 100 slices shown]
[im 4/100]
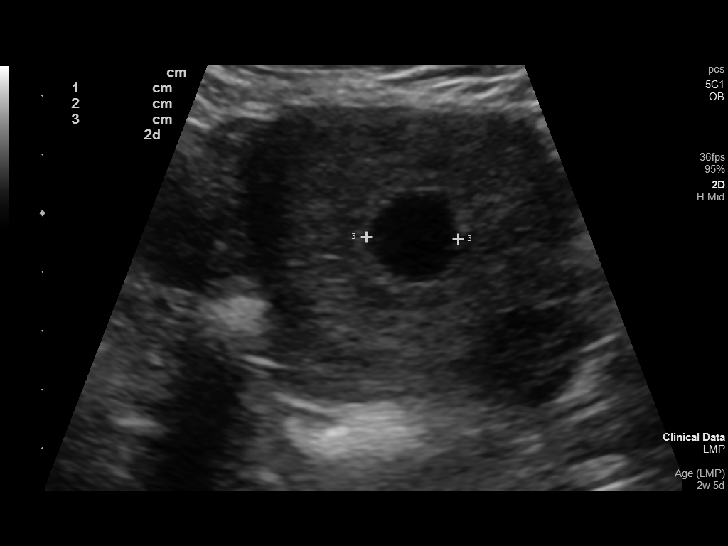
[im 12/100]
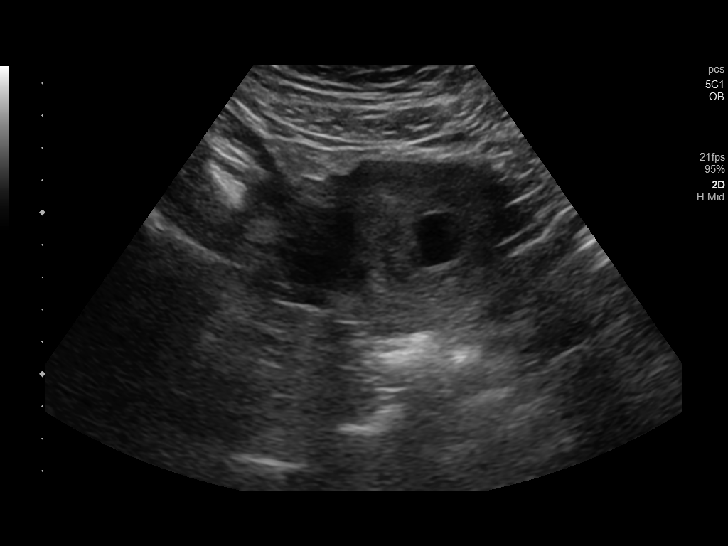
[im 19/100]
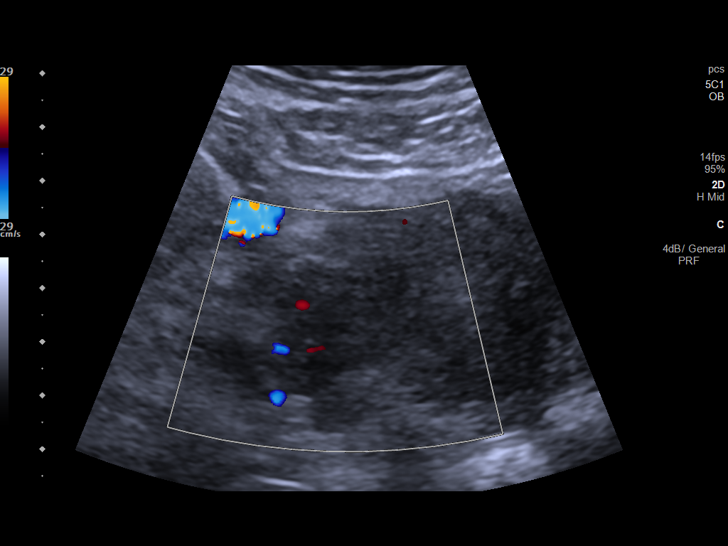
[im 26/100]
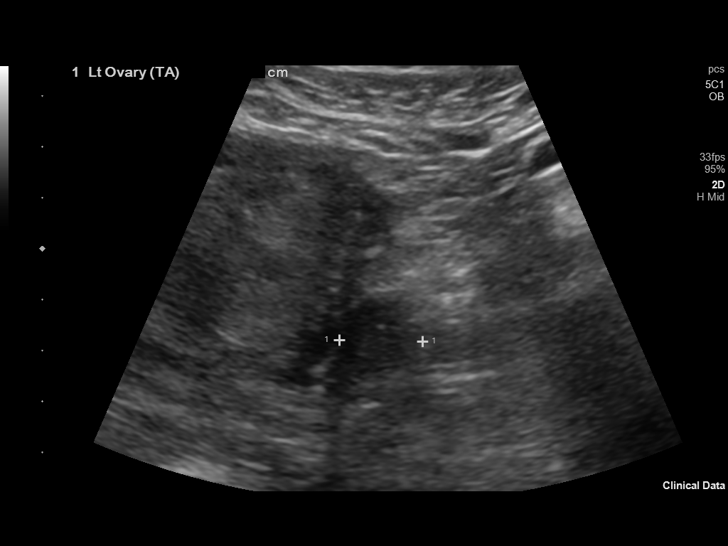
[im 34/100]
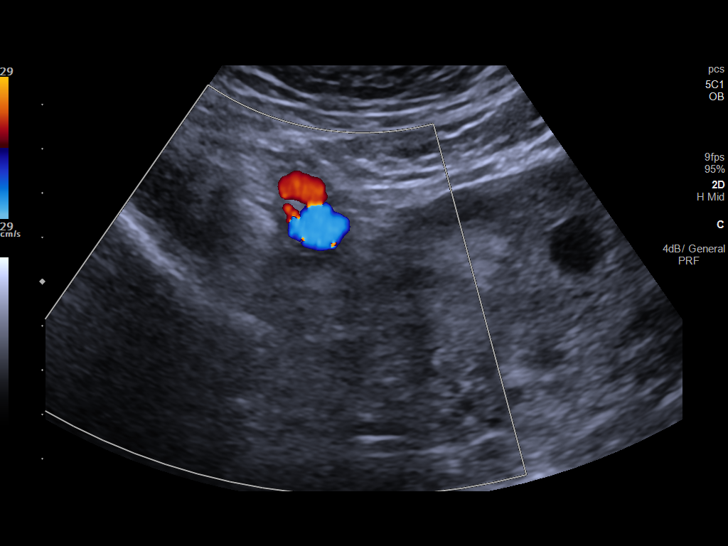
[im 41/100]
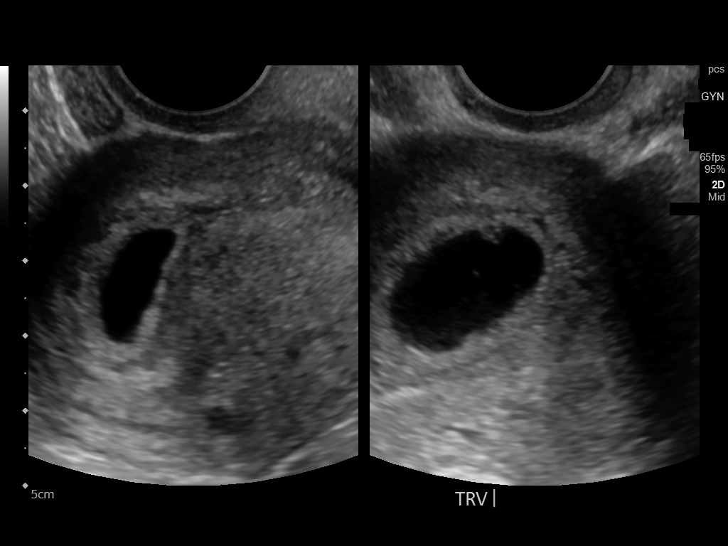
[im 48/100]
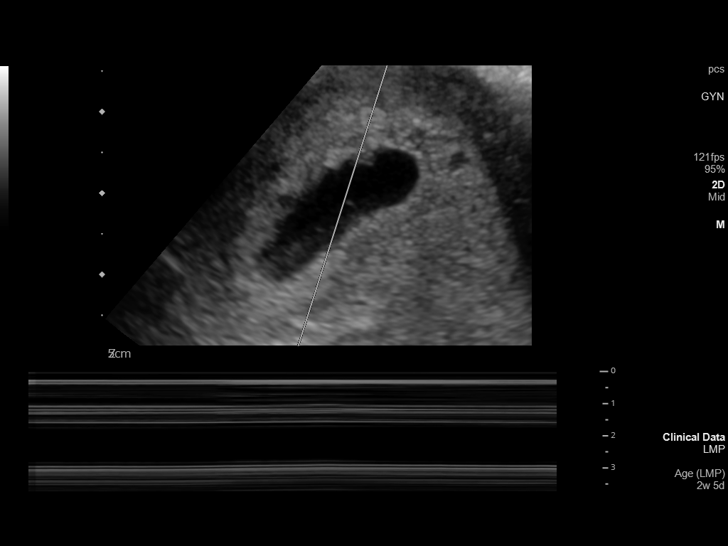
[im 56/100]
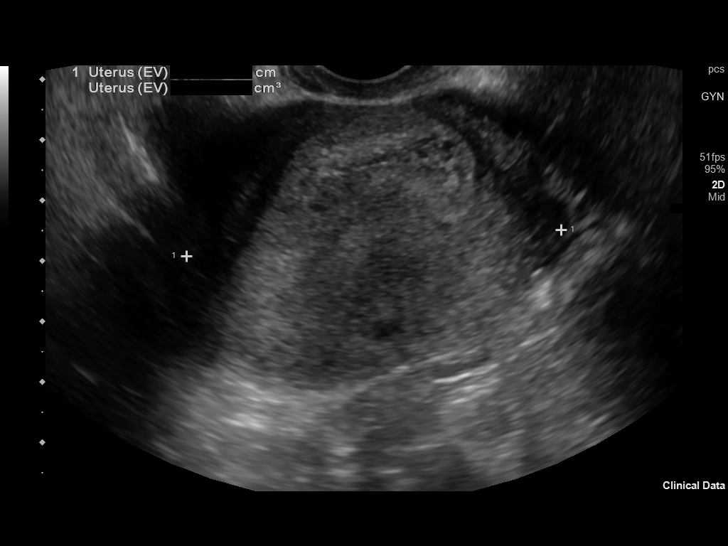
[im 63/100]
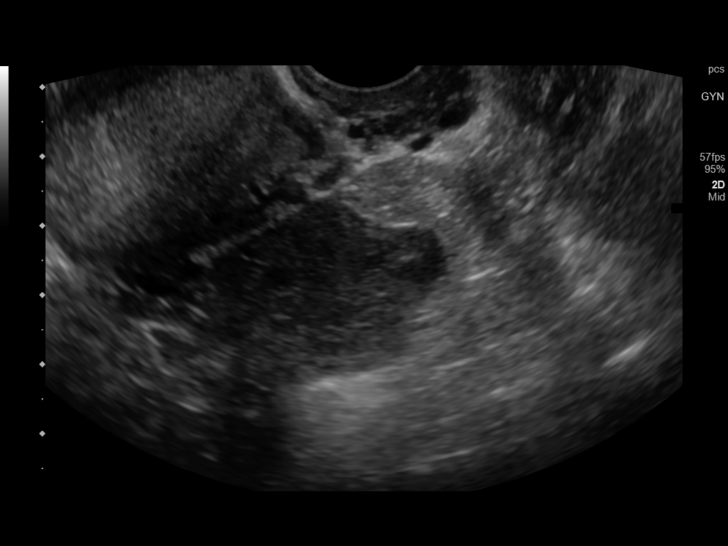
[im 70/100]
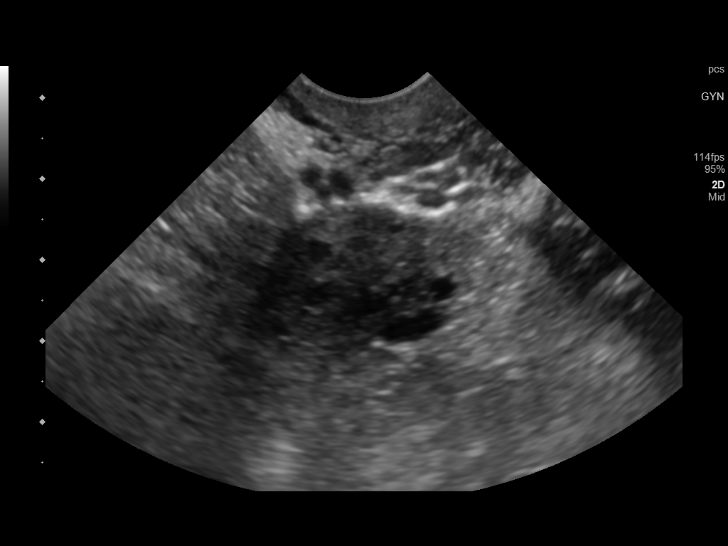
[im 78/100]
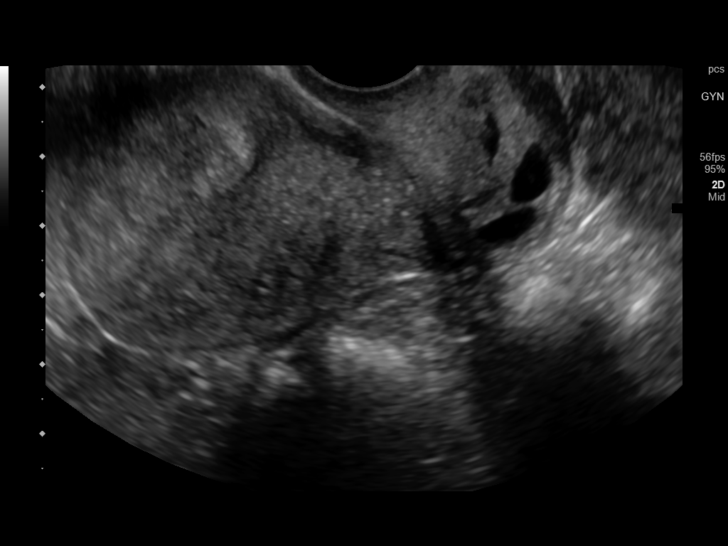
[im 85/100]
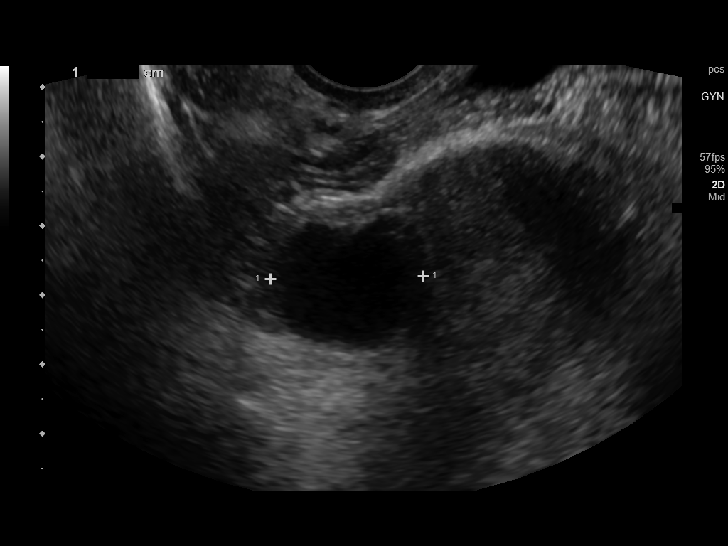
[im 92/100]
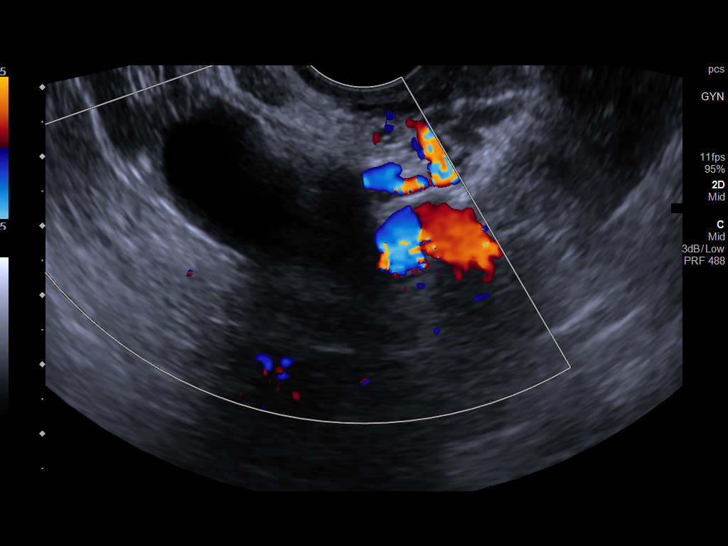
[im 100/100]
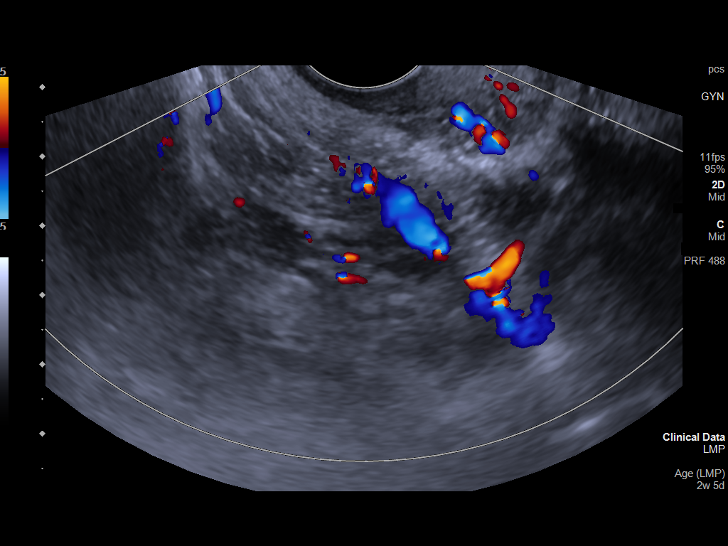

[14 of 28 positions shown; findings below may reference images not displayed]

FINDINGS: Intrauterine gestational sac: Single

Yolk sac:  Visualized.

Embryo:  Visualized.

Cardiac Activity: Not Visualized.

CRL:  3.4 mm   6 w   0 d                  US EDC: 07/02/2018

Subchorionic hemorrhage:  None visualized.

Maternal uterus/adnexae: Bilateral ovaries are within normal limits,
noting a 2.4 cm simple right paraovarian cyst, benign.

No free fluid.
IMPRESSION: Single intrauterine gestational sac with yolk sac and small fetal
pole, measuring 6 weeks 0 days by crown-rump length. No cardiac
activity is visualized, although this may simply be due to technical
limitations and early gestation.

Serial beta HCG is suggested, supplemented by short-term follow-up
pelvic ultrasound in 7-10 days as clinically warranted.

## 2020-01-21 IMAGING — US US OB TRANSVAGINAL
1 series · 15 of 28 positions shown · non-contrast
Comparison: None.

CLINICAL DATA: Bleeding for 4 days. Quantitative beta HCG on
Gestational age by LMP is 7 weeks 2 days.

EXAM:
TRANSVAGINAL OB ULTRASOUND
TECHNIQUE: Transvaginal ultrasound was performed for complete evaluation of the
gestation as well as the maternal uterus, adnexal regions, and
pelvic cul-de-sac.

[Series 1: us ob transvaginal · 90 acquisitions, 15 frames shown]
[im 1/90]
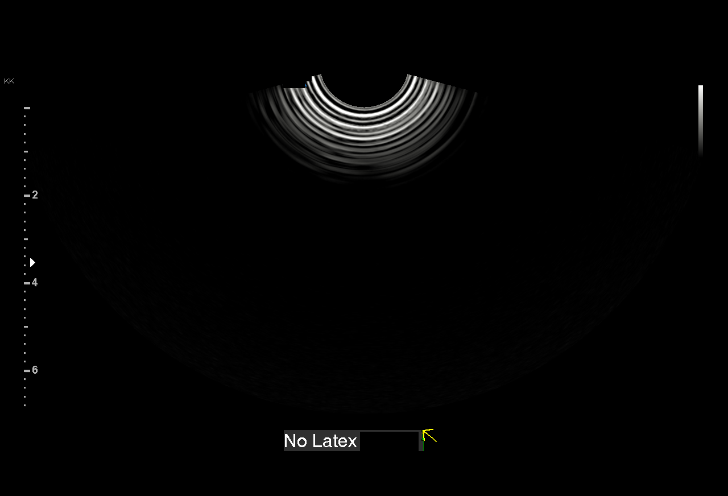
[im 7/90]
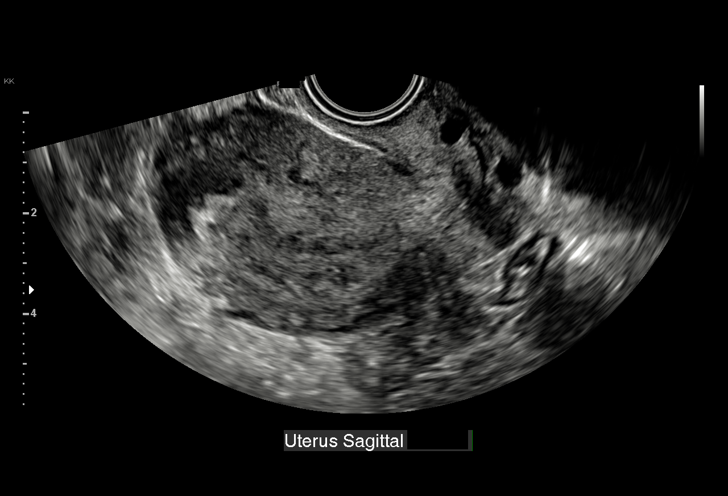
[im 14/90]
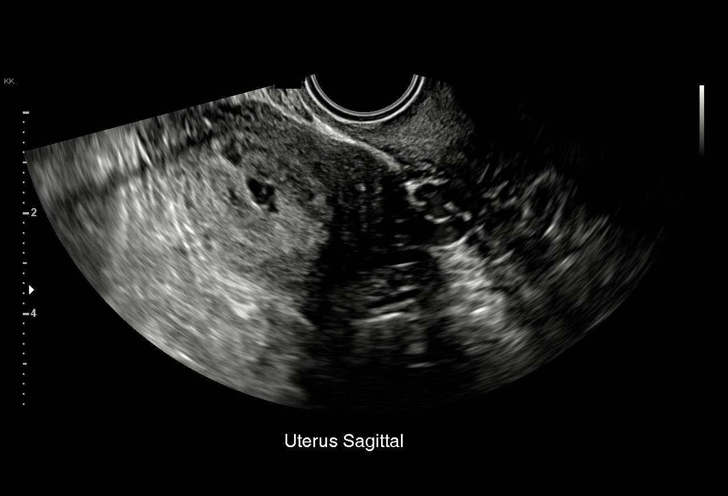
[im 20/90]
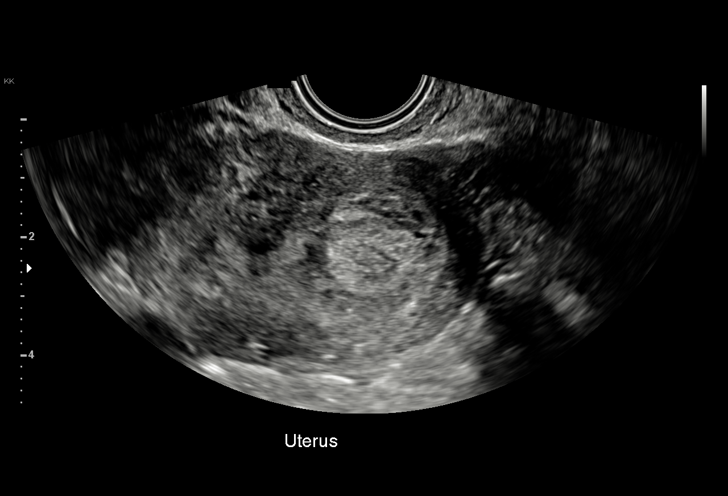
[im 27/90]
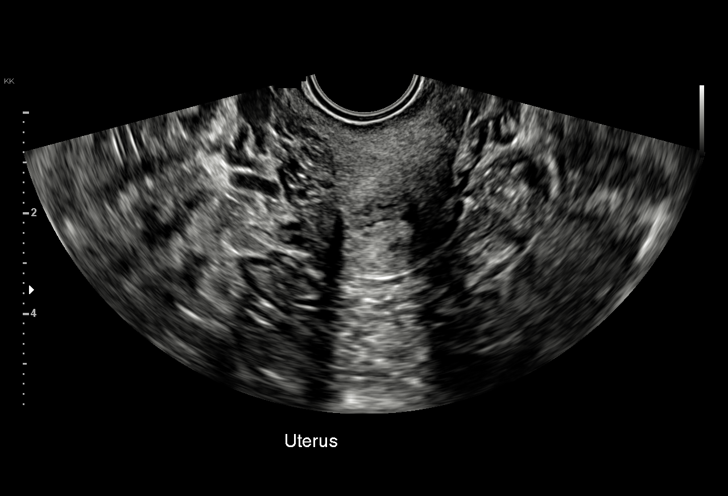
[im 33/90]
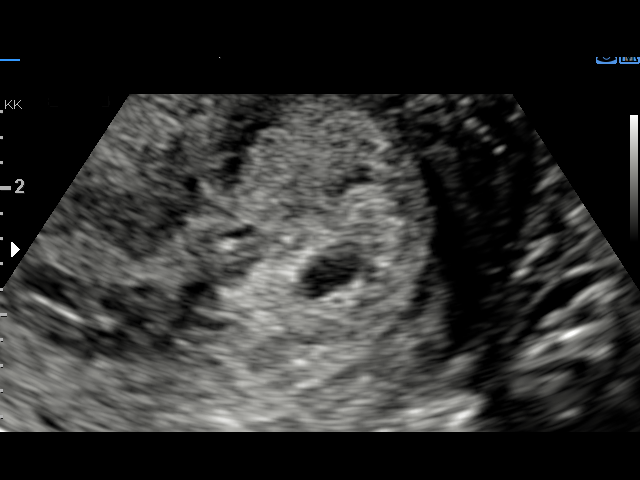
[im 40/90]
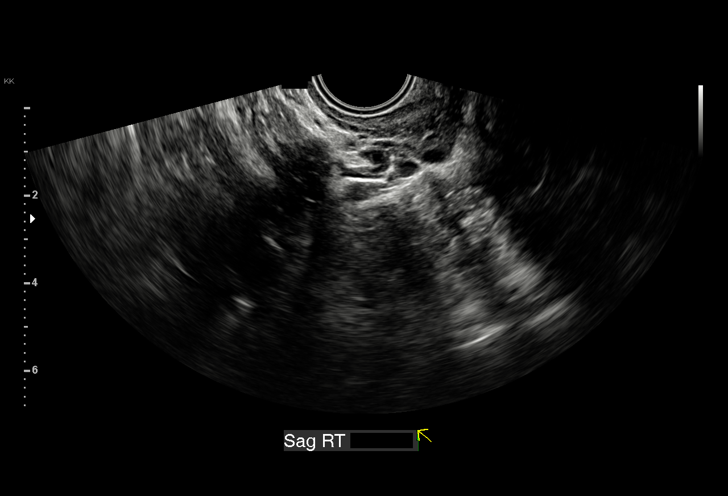
[im 47/90]
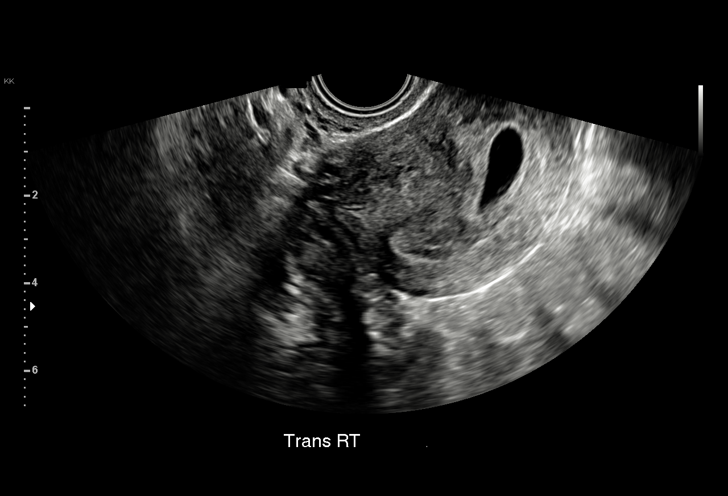
[im 50/90]
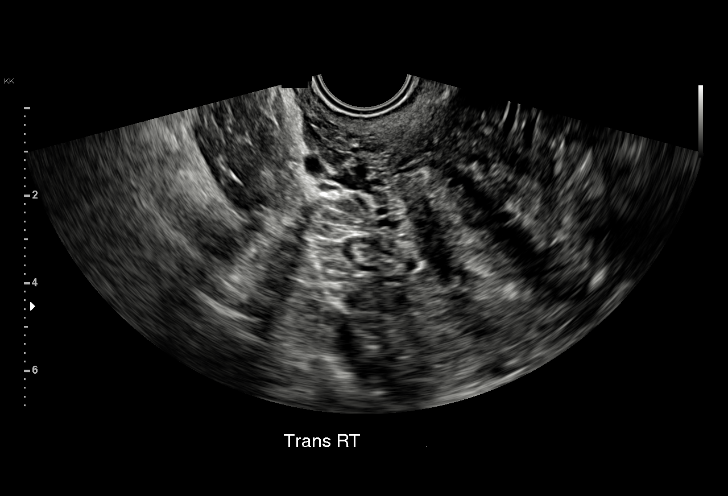
[im 57/90]
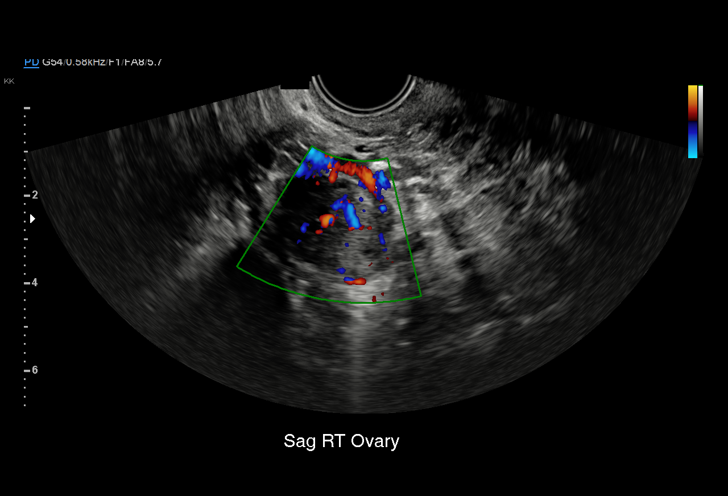
[im 63/90]
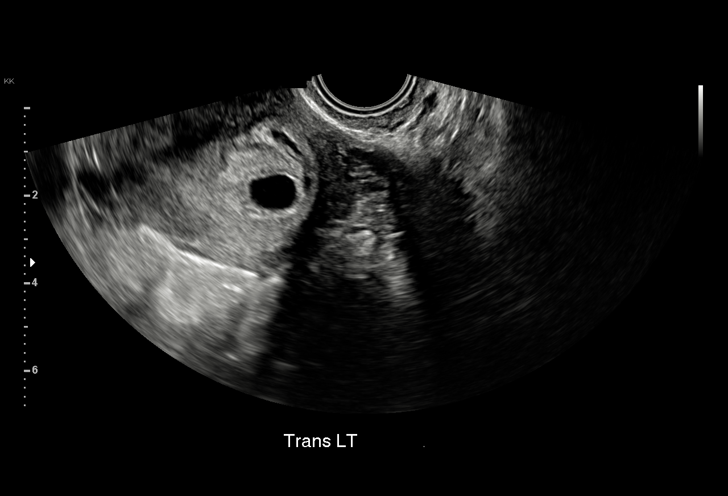
[im 70/90]
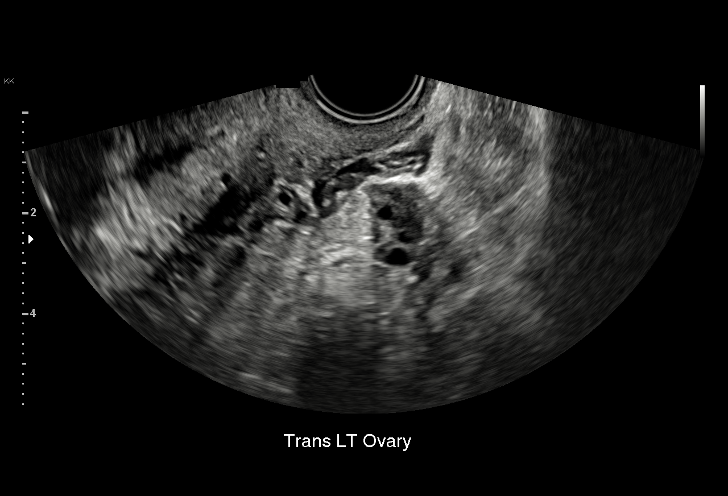
[im 76/90]
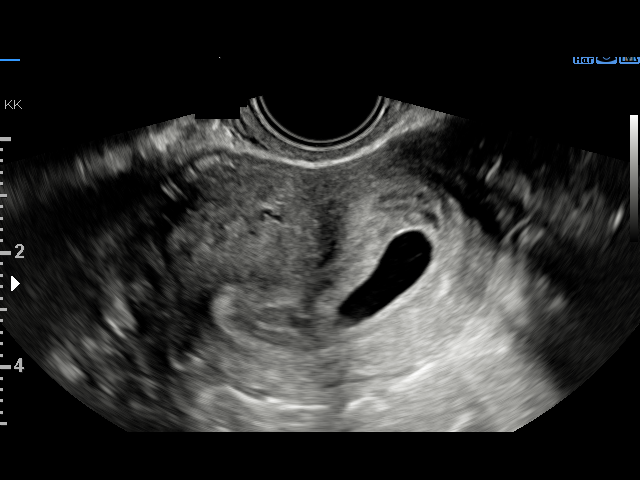
[im 83/90]
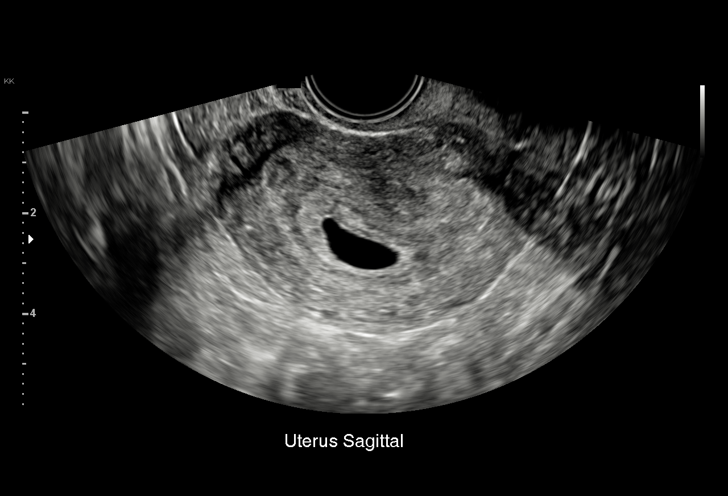
[im 90/90]
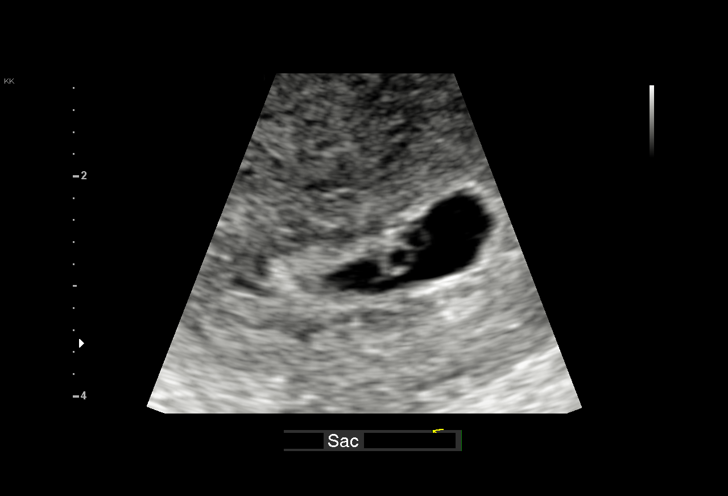

[15 of 28 positions shown; findings below may reference images not displayed]

FINDINGS: Intrauterine gestational sac: Single

Yolk sac:  2 yolk sacs are identified.

Embryo:  Not Visualized.

Cardiac Activity: Not Visualized.

Heart Rate: Absent bpm

MSD: 15.5 mm   6 w   2 d

Subchorionic hemorrhage: Small subchorionic hemorrhage identified.
The ovaries are normal in appearance. RIGHT corpus luteum cyst is
present.

Maternal uterus/adnexae: A para ovarian cyst is identified adjacent
to the RIGHT ovary. Question of arcuate versus septate uterus.

No free pelvic fluid.
IMPRESSION: 1. Single intrauterine gestational sac.
2. There are 2 yolk sacs, consistent with diamniotic monochorionic
twin gestation.
3. Given the lack of detectable embryos at this time, follow-up
ultrasound is recommended in 14 days to assess dating.

## 2020-11-11 DIAGNOSIS — O09299 Supervision of pregnancy with other poor reproductive or obstetric history, unspecified trimester: Secondary | ICD-10-CM | POA: Insufficient documentation

## 2021-11-12 ENCOUNTER — Observation Stay (HOSPITAL_COMMUNITY)
Admission: EM | Admit: 2021-11-12 | Discharge: 2021-11-13 | Disposition: A | Discharge status: Home / Self Care | Payer: Medicaid Other | Attending: General Surgery | Admitting: General Surgery

## 2021-11-12 ENCOUNTER — Emergency Department (HOSPITAL_COMMUNITY): Payer: Medicaid Other

## 2021-11-12 ENCOUNTER — Encounter (HOSPITAL_COMMUNITY): Payer: Self-pay

## 2021-11-12 ENCOUNTER — Other Ambulatory Visit: Payer: Self-pay

## 2021-11-12 DIAGNOSIS — R109 Unspecified abdominal pain: Secondary | ICD-10-CM | POA: Diagnosis present

## 2021-11-12 DIAGNOSIS — Z79899 Other long term (current) drug therapy: Secondary | ICD-10-CM | POA: Diagnosis not present

## 2021-11-12 DIAGNOSIS — K3580 Unspecified acute appendicitis: Secondary | ICD-10-CM | POA: Diagnosis not present

## 2021-11-12 DIAGNOSIS — K353 Acute appendicitis with localized peritonitis, without perforation or gangrene: Secondary | ICD-10-CM

## 2021-11-12 DIAGNOSIS — K37 Unspecified appendicitis: Secondary | ICD-10-CM

## 2021-11-12 LAB — COMPREHENSIVE METABOLIC PANEL
ALT: 20 U/L (ref 0–44)
AST: 21 U/L (ref 15–41)
Albumin: 4.2 g/dL (ref 3.5–5.0)
Alkaline Phosphatase: 77 U/L (ref 38–126)
Anion gap: 9 (ref 5–15)
BUN: 17 mg/dL (ref 6–20)
CO2: 23 mmol/L (ref 22–32)
Calcium: 9.1 mg/dL (ref 8.9–10.3)
Chloride: 107 mmol/L (ref 98–111)
Creatinine, Ser: 0.81 mg/dL (ref 0.44–1.00)
GFR, Estimated: >60 mL/min (ref >60)
Glucose, Bld: 105 mg/dL — ABNORMAL HIGH (ref 70–99)
Potassium: 3.5 mmol/L (ref 3.5–5.1)
Sodium: 139 mmol/L (ref 135–145)
Total Bilirubin: 0.3 mg/dL (ref 0.3–1.2)
Total Protein: 8.2 g/dL — ABNORMAL HIGH (ref 6.5–8.1)

## 2021-11-12 LAB — CBC
HCT: 37.9 % (ref 36.0–46.0)
Hemoglobin: 11.7 g/dL — ABNORMAL LOW (ref 12.0–15.0)
MCH: 24.9 pg — ABNORMAL LOW (ref 26.0–34.0)
MCHC: 30.9 g/dL (ref 30.0–36.0)
MCV: 80.6 fL (ref 80.0–100.0)
Platelets: 395 10*3/uL (ref 150–400)
RBC: 4.7 MIL/uL (ref 3.87–5.11)
RDW: 15.7 % — ABNORMAL HIGH (ref 11.5–15.5)
WBC: 13.2 10*3/uL — ABNORMAL HIGH (ref 4.0–10.5)
nRBC: 0 % (ref 0.0–0.2)

## 2021-11-12 LAB — URINALYSIS, ROUTINE W REFLEX MICROSCOPIC
Bilirubin Urine: NEGATIVE
Glucose, UA: NEGATIVE mg/dL
Hgb urine dipstick: NEGATIVE
Ketones, ur: 80 mg/dL — AB
Leukocytes,Ua: NEGATIVE
Nitrite: NEGATIVE
Protein, ur: NEGATIVE mg/dL
Specific Gravity, Urine: 1.031 — ABNORMAL HIGH (ref 1.005–1.030)
pH: 5 (ref 5.0–8.0)

## 2021-11-12 LAB — PREGNANCY, URINE: Preg Test, Ur: NEGATIVE

## 2021-11-12 LAB — LIPASE, BLOOD: Lipase: 36 U/L (ref 11–51)

## 2021-11-12 MED ORDER — ONDANSETRON HCL 4 MG/2ML IJ SOLN
4.0000 mg | Freq: Once | INTRAMUSCULAR | Status: AC
  Administered 2021-11-12: 4 mg via INTRAVENOUS
  Filled 2021-11-12: qty 2

## 2021-11-12 MED ORDER — MORPHINE SULFATE (PF) 4 MG/ML IV SOLN
4.0000 mg | Freq: Once | INTRAVENOUS | Status: AC
  Administered 2021-11-12: 4 mg via INTRAVENOUS
  Filled 2021-11-12: qty 1

## 2021-11-12 MED ORDER — ALUM & MAG HYDROXIDE-SIMETH 200-200-20 MG/5ML PO SUSP
30.0000 mL | Freq: Once | ORAL | Status: AC
  Administered 2021-11-12: 30 mL via ORAL
  Filled 2021-11-12: qty 30

## 2021-11-12 MED ORDER — LACTATED RINGERS IV BOLUS
1000.0000 mL | Freq: Once | INTRAVENOUS | Status: AC
  Administered 2021-11-12: 1000 mL via INTRAVENOUS

## 2021-11-12 MED ORDER — HYDROMORPHONE HCL 1 MG/ML IJ SOLN
1.0000 mg | Freq: Once | INTRAMUSCULAR | Status: AC
  Administered 2021-11-13: 1 mg via INTRAVENOUS
  Filled 2021-11-12: qty 1

## 2021-11-12 MED ORDER — PIPERACILLIN-TAZOBACTAM 3.375 G IVPB
3.3750 g | Freq: Three times a day (TID) | INTRAVENOUS | Status: DC
  Administered 2021-11-13 (×3): 3.375 g via INTRAVENOUS
  Filled 2021-11-12 (×3): qty 50

## 2021-11-12 MED ORDER — PANTOPRAZOLE SODIUM 40 MG IV SOLR
40.0000 mg | Freq: Once | INTRAVENOUS | Status: AC
  Administered 2021-11-12: 40 mg via INTRAVENOUS
  Filled 2021-11-12: qty 10

## 2021-11-12 MED ORDER — IOHEXOL 300 MG/ML  SOLN
100.0000 mL | Freq: Once | INTRAMUSCULAR | Status: AC | PRN
Start: 2021-11-12 — End: 2021-11-12
  Administered 2021-11-12: 100 mL via INTRAVENOUS

## 2021-11-12 MED ORDER — LIDOCAINE VISCOUS HCL 2 % MT SOLN
15.0000 mL | Freq: Once | OROMUCOSAL | Status: AC
  Administered 2021-11-12: 15 mL via ORAL
  Filled 2021-11-12: qty 15

## 2021-11-12 NOTE — ED Provider Notes (Signed)
11:04 PM Assumed care from Burgess Amor PA and Dr. Effie Shy, please see their note for full history, physical and decision making until this point. In brief this is a 26 y.o. year old female who presented to the ED tonight with Abdominal Pain     A couple days of diarrhea, nonbloody, afebrile. This evening with acute onset of epigastric pain and now vomiting. Pending treatments, fluids, ct scan and reeval.   Discharge instructions, including strict return precautions for new or worsening symptoms, given. Patient and/or family verbalized understanding and agreement with the plan as described.   Labs, studies and imaging reviewed by myself and considered in medical decision making if ordered. Imaging interpreted by radiology.  Labs Reviewed  COMPREHENSIVE METABOLIC PANEL - Abnormal; Notable for the following components:      Result Value   Glucose, Bld 105 (*)    Total Protein 8.2 (*)    All other components within normal limits  CBC - Abnormal; Notable for the following components:   WBC 13.2 (*)    Hemoglobin 11.7 (*)    MCH 24.9 (*)    RDW 15.7 (*)    All other components within normal limits  URINALYSIS, ROUTINE W REFLEX MICROSCOPIC - Abnormal; Notable for the following components:   APPearance HAZY (*)    Specific Gravity, Urine 1.031 (*)    Ketones, ur 80 (*)    All other components within normal limits  LIPASE, BLOOD  PREGNANCY, URINE  POC URINE PREG, ED    CT Abdomen Pelvis W Contrast    (Results Pending)    No follow-ups on file.

## 2021-11-12 NOTE — ED Provider Notes (Signed)
[facility] EMERGENCY DEPARTMENT Provider Note   CSN: [id number] Arrival date & time: 11/12/21  5329     History  Chief Complaint  Patient presents with   Abdominal Pain    Hser Belanger is a 26 y.o. female.  The history is provided by the patient.  Abdominal Pain Pain location:  Epigastric Pain quality: sharp and stabbing   Pain radiates to:  Back Pain severity:  Severe Onset quality:  Gradual Duration:  5 hours Timing:  Constant Progression:  Worsening Chronicity:  New Context: not diet changes, not eating and not suspicious food intake   Relieved by:  Nothing Worsened by:  Movement Ineffective treatments:  None tried Associated symptoms: diarrhea, nausea and vomiting   Associated symptoms: no chills and no fever   Risk factors: has not had multiple surgeries and not pregnant    Patient describes having loose stools since she woke this morning, multiple episodes of nonbloody diarrhea, then developed upper abdominal pain around 5 PM today which has escalated and is now severe and radiates into her back.  She also endorses nausea and vomiting which started just after arrival here.  Denies history of PUD or acid reflux problems.  Has been afebrile.  She is 6 months postpartum.  She is not breast-feeding.     Home Medications Prior to Admission medications   Medication Sig Start Date End Date Taking? Authorizing Provider  doxylamine, Sleep, (UNISOM) 25 MG tablet Take 25 mg by mouth at bedtime as needed for sleep.    [provider]  Doxylamine-Pyridoxine (DICLEGIS) 10-10 MG TBEC Take 2 tablets by mouth 2 (two) times daily. Patient not taking: Reported on 03/04/2018 02/21/18   Gilda Crease, MD  metoCLOPramide (REGLAN) 10 MG tablet Take 1 tablet (10 mg total) by mouth every 8 (eight) hours as needed for nausea or vomiting. 02/21/18   Gilda Crease, MD  ondansetron (ZOFRAN ODT) 4 MG disintegrating tablet Take 1 tablet (4 mg total) by mouth every  8 (eight) hours as needed for nausea or vomiting. 04/20/18   McDonald, Mia A, PA-C  promethazine (PHENERGAN) 25 MG suppository Place 1 suppository (25 mg total) rectally every 6 (six) hours as needed for nausea or vomiting. 04/20/18   McDonald, Mia A, PA-C  Pyridoxine HCl (VITAMIN B-6 PO) Take 1 tablet by mouth daily.    [provider]      Allergies    Septra [sulfamethoxazole-trimethoprim]    Review of Systems   Review of Systems  Constitutional:  Negative for chills and fever.  Gastrointestinal:  Positive for abdominal pain, diarrhea, nausea and vomiting.    Physical Exam Updated Vital Signs BP (!) 146/95   Pulse 95   Temp 98.3 F (36.8 C) (Oral)   Resp 18   Ht 5\' 3"  (1.6 m)   Wt 77.1 kg   LMP 10/13/2021   SpO2 100%   BMI 30.11 kg/m  Physical Exam Vitals and nursing note reviewed.  Constitutional:      General: She is in acute distress.     Appearance: She is well-developed.     Comments: Appears uncomfortable.  HENT:     Head: Normocephalic and atraumatic.  Eyes:     Conjunctiva/sclera: Conjunctivae normal.  Cardiovascular:     Rate and Rhythm: Normal rate and regular rhythm.     Heart sounds: Normal heart sounds.  Pulmonary:     Effort: Pulmonary effort is normal.     Breath sounds: Normal breath sounds. No wheezing.  Abdominal:     General: Bowel sounds are normal.     Palpations: Abdomen is soft.     Tenderness: There is abdominal tenderness in the right upper quadrant and epigastric area. There is guarding.     Comments: Patient is guarding in the epigastric region.  Musculoskeletal:        General: Normal range of motion.     Cervical back: Normal range of motion.  Skin:    General: Skin is warm and dry.  Neurological:     Mental Status: She is alert.     ED Results / Procedures / Treatments   Labs (all labs ordered are listed, but only abnormal results are displayed) Labs Reviewed  COMPREHENSIVE METABOLIC PANEL - Abnormal; Notable for  the following components:      Result Value   Glucose, Bld 105 (*)    Total Protein 8.2 (*)    All other components within normal limits  CBC - Abnormal; Notable for the following components:   WBC 13.2 (*)    Hemoglobin 11.7 (*)    MCH 24.9 (*)    RDW 15.7 (*)    All other components within normal limits  URINALYSIS, ROUTINE W REFLEX MICROSCOPIC - Abnormal; Notable for the following components:   APPearance HAZY (*)    Specific Gravity, Urine 1.031 (*)    Ketones, ur 80 (*)    All other components within normal limits  LIPASE, BLOOD  PREGNANCY, URINE  POC URINE PREG, ED    EKG None  Radiology No results found.  Procedures Procedures    Medications Ordered in ED Medications  iohexol (OMNIPAQUE) 300 MG/ML solution 100 mL (has no administration in time range)  morphine (PF) 4 MG/ML injection 4 mg (4 mg Intravenous Given 11/12/21 2304)  ondansetron (ZOFRAN) injection 4 mg (4 mg Intravenous Given 11/12/21 2304)  pantoprazole (PROTONIX) injection 40 mg (40 mg Intravenous Given 11/12/21 2302)    ED Course/ Medical Decision Making/ A&P                           Medical Decision Making Patient with a 1 day history of diarrhea with development of epigastric pain and nausea and vomiting later this evening.  No other family members have similar symptoms and all of consumed the same foods.  Denies prior similar symptoms.  Concern for PUD/GERD, biliary colic acute cholecystitis.  Other possibilities include small bowel obstruction, hepatitis, pancreatitis.  Pending labs and imaging at this time.  Pt sign out to Dr. Clayborne Dana pending CT imaging  Amount and/or Complexity of Data Reviewed Labs: ordered. Radiology: ordered.  Risk Prescription drug management.           Final Clinical Impression(s) / ED Diagnoses Final diagnoses:  None    Rx / DC Orders ED Discharge Orders     None         Victoriano Lain 11/12/21 2305    Mesner, Barbara Cower, MD 11/13/21 [phone]

## 2021-11-12 NOTE — ED Triage Notes (Signed)
Pt presents with abdominal pain that started x2 hrs ago. Pain is in upper abdominal and pt endorses nausea. Describes pain as burning and sharp. Pt denies any urinary symptoms.

## 2021-11-13 ENCOUNTER — Inpatient Hospital Stay (HOSPITAL_COMMUNITY): Payer: Medicaid Other

## 2021-11-13 ENCOUNTER — Encounter (HOSPITAL_COMMUNITY)
Admission: EM | Disposition: A | Discharge status: Home / Self Care | Payer: Self-pay | Source: Home / Self Care | Attending: Emergency Medicine

## 2021-11-13 ENCOUNTER — Encounter (HOSPITAL_COMMUNITY): Payer: Self-pay | Admitting: Internal Medicine

## 2021-11-13 ENCOUNTER — Other Ambulatory Visit: Payer: Self-pay

## 2021-11-13 DIAGNOSIS — K3580 Unspecified acute appendicitis: Secondary | ICD-10-CM

## 2021-11-13 DIAGNOSIS — K353 Acute appendicitis with localized peritonitis, without perforation or gangrene: Secondary | ICD-10-CM | POA: Diagnosis not present

## 2021-11-13 DIAGNOSIS — K37 Unspecified appendicitis: Secondary | ICD-10-CM

## 2021-11-13 HISTORY — PX: LAPAROSCOPIC APPENDECTOMY: SHX408

## 2021-11-13 HISTORY — DX: Unspecified appendicitis: K37

## 2021-11-13 HISTORY — DX: Unspecified acute appendicitis: K35.80

## 2021-11-13 LAB — HIV ANTIBODY (ROUTINE TESTING W REFLEX): HIV Screen 4th Generation wRfx: NONREACTIVE

## 2021-11-13 SURGERY — APPENDECTOMY, LAPAROSCOPIC
Anesthesia: General | Site: Abdomen

## 2021-11-13 MED ORDER — ACETAMINOPHEN 650 MG RE SUPP: 650.0000 mg | Freq: Four times a day (QID) | RECTAL | Status: DC | PRN

## 2021-11-13 MED ORDER — ROCURONIUM BROMIDE 10 MG/ML (PF) SYRINGE
PREFILLED_SYRINGE | INTRAVENOUS | Status: DC | PRN
  Administered 2021-11-13: 60 mg via INTRAVENOUS

## 2021-11-13 MED ORDER — CHLORHEXIDINE GLUCONATE CLOTH 2 % EX PADS: 6.0000 | MEDICATED_PAD | Freq: Once | CUTANEOUS | Status: DC

## 2021-11-13 MED ORDER — ROCURONIUM BROMIDE 10 MG/ML (PF) SYRINGE
PREFILLED_SYRINGE | INTRAVENOUS | Status: AC
  Filled 2021-11-13: qty 10

## 2021-11-13 MED ORDER — SUGAMMADEX SODIUM 200 MG/2ML IV SOLN
INTRAVENOUS | Status: DC | PRN
  Administered 2021-11-13: 200 mg via INTRAVENOUS

## 2021-11-13 MED ORDER — HYDROMORPHONE HCL 1 MG/ML IJ SOLN: 1.0000 mg | INTRAMUSCULAR | Status: DC | PRN

## 2021-11-13 MED ORDER — ONDANSETRON 4 MG PO TBDP: 4.0000 mg | ORAL_TABLET | Freq: Four times a day (QID) | ORAL | Status: DC | PRN

## 2021-11-13 MED ORDER — LORAZEPAM 2 MG/ML IJ SOLN: 1.0000 mg | INTRAMUSCULAR | Status: DC | PRN

## 2021-11-13 MED ORDER — SODIUM CHLORIDE 0.45 % IV SOLN: INTRAVENOUS | Status: DC

## 2021-11-13 MED ORDER — LIDOCAINE HCL (PF) 2 % IJ SOLN
INTRAMUSCULAR | Status: AC
  Filled 2021-11-13: qty 5

## 2021-11-13 MED ORDER — OXYCODONE HCL 5 MG PO TABS: 5.0000 mg | ORAL_TABLET | ORAL | Status: DC | PRN

## 2021-11-13 MED ORDER — PROPOFOL 500 MG/50ML IV EMUL
INTRAVENOUS | Status: DC | PRN
  Administered 2021-11-13: 150 ug/kg/min via INTRAVENOUS

## 2021-11-13 MED ORDER — MIDAZOLAM HCL 2 MG/2ML IJ SOLN
INTRAMUSCULAR | Status: AC
  Filled 2021-11-13: qty 2

## 2021-11-13 MED ORDER — DEXMEDETOMIDINE (PRECEDEX) IN NS 20 MCG/5ML (4 MCG/ML) IV SYRINGE
PREFILLED_SYRINGE | INTRAVENOUS | Status: DC | PRN
  Administered 2021-11-13: 8 ug via INTRAVENOUS

## 2021-11-13 MED ORDER — PROPOFOL 10 MG/ML IV BOLUS
INTRAVENOUS | Status: AC
  Filled 2021-11-13: qty 20

## 2021-11-13 MED ORDER — DOXYLAMINE SUCCINATE (SLEEP) 25 MG PO TABS: 25.0000 mg | ORAL_TABLET | Freq: Every evening | ORAL | Status: DC | PRN

## 2021-11-13 MED ORDER — LACTATED RINGERS IV SOLN: INTRAVENOUS | Status: DC

## 2021-11-13 MED ORDER — LIDOCAINE HCL (PF) 2 % IJ SOLN
INTRAMUSCULAR | Status: AC
  Filled 2021-11-13: qty 10

## 2021-11-13 MED ORDER — FENTANYL CITRATE (PF) 250 MCG/5ML IJ SOLN
INTRAMUSCULAR | Status: AC
  Filled 2021-11-13: qty 5

## 2021-11-13 MED ORDER — HEPARIN SODIUM (PORCINE) 5000 UNIT/ML IJ SOLN
5000.0000 [IU] | Freq: Three times a day (TID) | INTRAMUSCULAR | Status: DC
Start: 2021-11-13 — End: 2021-11-13
  Administered 2021-11-13: 5000 [IU] via SUBCUTANEOUS
  Filled 2021-11-13: qty 1

## 2021-11-13 MED ORDER — PROPOFOL 10 MG/ML IV BOLUS
INTRAVENOUS | Status: DC | PRN
  Administered 2021-11-13: 200 mg via INTRAVENOUS

## 2021-11-13 MED ORDER — SODIUM CHLORIDE 0.9 % IV SOLN: INTRAVENOUS | Status: DC

## 2021-11-13 MED ORDER — ACETAMINOPHEN 325 MG PO TABS: 650.0000 mg | ORAL_TABLET | Freq: Four times a day (QID) | ORAL | Status: DC | PRN

## 2021-11-13 MED ORDER — KETOROLAC TROMETHAMINE 30 MG/ML IJ SOLN
30.0000 mg | Freq: Four times a day (QID) | INTRAMUSCULAR | Status: DC
  Administered 2021-11-13: 30 mg via INTRAVENOUS
  Filled 2021-11-13 (×2): qty 1

## 2021-11-13 MED ORDER — CHLORHEXIDINE GLUCONATE 0.12 % MT SOLN
OROMUCOSAL | Status: AC
  Administered 2021-11-13: 15 mL via OROMUCOSAL
  Filled 2021-11-13: qty 15

## 2021-11-13 MED ORDER — OXYCODONE HCL 5 MG PO TABS: 5.0000 mg | ORAL_TABLET | ORAL | 0 refills | Status: AC | PRN

## 2021-11-13 MED ORDER — SODIUM CHLORIDE 0.9 % IR SOLN
Status: DC | PRN
  Administered 2021-11-13: 1000 mL

## 2021-11-13 MED ORDER — SIMETHICONE 80 MG PO CHEW: 40.0000 mg | CHEWABLE_TABLET | Freq: Four times a day (QID) | ORAL | Status: DC | PRN

## 2021-11-13 MED ORDER — BUPIVACAINE LIPOSOME 1.3 % IJ SUSP
INTRAMUSCULAR | Status: AC
  Filled 2021-11-13: qty 20

## 2021-11-13 MED ORDER — ONDANSETRON HCL 4 MG/2ML IJ SOLN: 4.0000 mg | Freq: Four times a day (QID) | INTRAMUSCULAR | Status: DC | PRN

## 2021-11-13 MED ORDER — DEXAMETHASONE SODIUM PHOSPHATE 4 MG/ML IJ SOLN
INTRAMUSCULAR | Status: DC | PRN
  Administered 2021-11-13: 4 mg via INTRAVENOUS

## 2021-11-13 MED ORDER — ORAL CARE MOUTH RINSE: 15.0000 mL | Freq: Once | OROMUCOSAL | Status: AC

## 2021-11-13 MED ORDER — PROPOFOL 500 MG/50ML IV EMUL
INTRAVENOUS | Status: AC
  Filled 2021-11-13: qty 200

## 2021-11-13 MED ORDER — BUPIVACAINE LIPOSOME 1.3 % IJ SUSP
INTRAMUSCULAR | Status: DC | PRN
  Administered 2021-11-13: 20 mL

## 2021-11-13 MED ORDER — FENTANYL CITRATE (PF) 100 MCG/2ML IJ SOLN
INTRAMUSCULAR | Status: DC | PRN
Start: 2021-11-13 — End: 2021-11-13
  Administered 2021-11-13 (×4): 50 ug via INTRAVENOUS

## 2021-11-13 MED ORDER — KETOROLAC TROMETHAMINE 30 MG/ML IJ SOLN
30.0000 mg | Freq: Four times a day (QID) | INTRAMUSCULAR | Status: AC
  Administered 2021-11-13: 30 mg via INTRAVENOUS

## 2021-11-13 MED ORDER — LIDOCAINE HCL (CARDIAC) PF 100 MG/5ML IV SOSY
PREFILLED_SYRINGE | INTRAVENOUS | Status: DC | PRN
  Administered 2021-11-13: 40 mg via INTRAVENOUS

## 2021-11-13 MED ORDER — HYDROMORPHONE HCL 1 MG/ML IJ SOLN: 0.2500 mg | INTRAMUSCULAR | Status: DC | PRN

## 2021-11-13 MED ORDER — ENOXAPARIN SODIUM 40 MG/0.4ML IJ SOSY
40.0000 mg | PREFILLED_SYRINGE | INTRAMUSCULAR | Status: DC
Start: 2021-11-14 — End: 2021-11-13

## 2021-11-13 MED ORDER — CHLORHEXIDINE GLUCONATE 0.12 % MT SOLN: 15.0000 mL | Freq: Once | OROMUCOSAL | Status: AC

## 2021-11-13 MED ORDER — KETOROLAC TROMETHAMINE 30 MG/ML IJ SOLN: 30.0000 mg | Freq: Four times a day (QID) | INTRAMUSCULAR | Status: DC | PRN

## 2021-11-13 MED ORDER — ONDANSETRON HCL 4 MG/2ML IJ SOLN
INTRAMUSCULAR | Status: DC | PRN
  Administered 2021-11-13: 4 mg via INTRAVENOUS

## 2021-11-13 MED ORDER — MIDAZOLAM HCL 5 MG/5ML IJ SOLN
INTRAMUSCULAR | Status: DC | PRN
  Administered 2021-11-13: 2 mg via INTRAVENOUS

## 2021-11-13 MED ORDER — MEPERIDINE HCL 50 MG/ML IJ SOLN: 6.2500 mg | INTRAMUSCULAR | Status: DC | PRN

## 2021-11-13 SURGICAL SUPPLY — 43 items
CHLORAPREP W/TINT 26 (MISCELLANEOUS) ×2 IMPLANT
CLOTH BEACON ORANGE TIMEOUT ST (SAFETY) ×2 IMPLANT
COVER LIGHT HANDLE STERIS (MISCELLANEOUS) ×4 IMPLANT
CUTTER FLEX LINEAR 45M (STAPLE) ×2 IMPLANT
DERMABOND ADVANCED (GAUZE/BANDAGES/DRESSINGS) ×1
DERMABOND ADVANCED .7 DNX12 (GAUZE/BANDAGES/DRESSINGS) ×1 IMPLANT
ELECT REM PT RETURN 9FT ADLT (ELECTROSURGICAL) ×2
ELECTRODE REM PT RTRN 9FT ADLT (ELECTROSURGICAL) ×1 IMPLANT
GLOVE BIOGEL PI IND STRL 7.0 (GLOVE) ×2 IMPLANT
GLOVE BIOGEL PI INDICATOR 7.0 (GLOVE) ×2
GLOVE SURG SS PI 7.5 STRL IVOR (GLOVE) ×2 IMPLANT
GOWN STRL REUS W/TWL LRG LVL3 (GOWN DISPOSABLE) ×4 IMPLANT
INST SET LAPROSCOPIC AP (KITS) ×2 IMPLANT
KIT TURNOVER KIT A (KITS) ×2 IMPLANT
MANIFOLD NEPTUNE II (INSTRUMENTS) ×2 IMPLANT
NDL HYPO 18GX1.5 BLUNT FILL (NEEDLE) ×1 IMPLANT
NDL HYPO 21X1.5 SAFETY (NEEDLE) ×1 IMPLANT
NDL INSUFFLATION 14GA 120MM (NEEDLE) ×1 IMPLANT
NEEDLE HYPO 18GX1.5 BLUNT FILL (NEEDLE) ×2 IMPLANT
NEEDLE HYPO 21X1.5 SAFETY (NEEDLE) ×2 IMPLANT
NEEDLE INSUFFLATION 14GA 120MM (NEEDLE) ×2 IMPLANT
NS IRRIG 1000ML POUR BTL (IV SOLUTION) ×2 IMPLANT
PACK LAP CHOLE LZT030E (CUSTOM PROCEDURE TRAY) ×2 IMPLANT
PAD ARMBOARD 7.5X6 YLW CONV (MISCELLANEOUS) ×2 IMPLANT
PENCIL HANDSWITCHING (ELECTRODE) ×1 IMPLANT
RELOAD 45 VASCULAR/THIN (ENDOMECHANICALS) ×2 IMPLANT
RELOAD STAPLE 45 2.5 WHT GRN (ENDOMECHANICALS) IMPLANT
SET BASIN LINEN APH (SET/KITS/TRAYS/PACK) ×2 IMPLANT
SET TUBE IRRIG SUCTION NO TIP (IRRIGATION / IRRIGATOR) ×2 IMPLANT
SET TUBE SMOKE EVAC HIGH FLOW (TUBING) ×2 IMPLANT
SHEARS HARMONIC ACE PLUS 36CM (ENDOMECHANICALS) ×2 IMPLANT
SUT MNCRL AB 4-0 PS2 18 (SUTURE) ×4 IMPLANT
SUT VICRYL 0 UR6 27IN ABS (SUTURE) ×2 IMPLANT
SYR 20ML LL LF (SYRINGE) ×4 IMPLANT
SYS BAG RETRIEVAL 10MM (BASKET) ×2
SYSTEM BAG RETRIEVAL 10MM (BASKET) ×1 IMPLANT
TRAY FOLEY W/BAG SLVR 16FR (SET/KITS/TRAYS/PACK) ×1
TRAY FOLEY W/BAG SLVR 16FR ST (SET/KITS/TRAYS/PACK) ×1 IMPLANT
TROCAR Z-THAD FIOS HNDL 12X100 (TROCAR) ×2 IMPLANT
TROCAR Z-THRD FIOS HNDL 11X100 (TROCAR) ×2 IMPLANT
TROCAR Z-THREAD FIOS 5X100MM (TROCAR) ×2 IMPLANT
TUBE CONNECTING 12X1/4 (SUCTIONS) ×2 IMPLANT
WARMER LAPAROSCOPE (MISCELLANEOUS) ×2 IMPLANT

## 2021-11-13 NOTE — Progress Notes (Signed)
Report given to Cierra on 300. This RN informed receiving nurse of new floor orders per Dr. Lovell Sheehan.

## 2021-11-13 NOTE — Discharge Summary (Signed)
Physician Discharge Summary  Patient ID: Wanda Erickson MRN: [id number] DOB/AGE: 1995/05/06 26 y.o.  Admit date: 11/12/2021 Discharge date: 11/13/2021  Admission Diagnoses: Acute appendicitis  Discharge Diagnoses:  Principal Problem:   Appendicitis, acute Active Problems:   Appendicitis   Discharged Condition: good  Hospital Course: Patient is a 26 year old white female who presented emergency room with worsening right lower quadrant abdominal pain.  CT scan of the abdomen revealed acute appendicitis with appendicolith.  The patient was brought into the hospital on 11/12/2021.  She subsequently underwent a laparoscopic appendectomy on 11/13/2021.  She tolerated the surgery well.  Her postoperative course has been unremarkable.  Her diet was advanced out difficulty.  The patient was discharged home on 11/13/2021 in good and improving condition.  Treatments: surgery: Laparoscopic appendectomy on 11/13/2021  Discharge Exam: Blood pressure 117/73, pulse 100, temperature 98 F (36.7 C), resp. rate 16, height 5\' 3"  (1.6 m), weight 84 kg, last menstrual period 10/13/2021, SpO2 100 %, unknown if currently breastfeeding. General appearance: alert, cooperative, and no distress Resp: clear to auscultation bilaterally Cardio: regular rate and rhythm, S1, S2 normal, no murmur, click, rub or gallop GI: Soft, incisions healing well.  Disposition: Discharge disposition: 01-Home or Self Care       Discharge Instructions     Diet - low sodium heart healthy   Complete by: As directed    Increase activity slowly   Complete by: As directed       Allergies as of 11/13/2021       Reactions   Septra [sulfamethoxazole-trimethoprim]         Medication List     TAKE these medications    doxylamine (Sleep) 25 MG tablet Commonly known as: UNISOM Take 25 mg by mouth at bedtime as needed for sleep.   Doxylamine-Pyridoxine 10-10 MG Tbec Commonly known as: Diclegis Take 2 tablets by mouth 2  (two) times daily.   metoCLOPramide 10 MG tablet Commonly known as: Reglan Take 1 tablet (10 mg total) by mouth every 8 (eight) hours as needed for nausea or vomiting.   ondansetron 4 MG disintegrating tablet Commonly known as: Zofran ODT Take 1 tablet (4 mg total) by mouth every 8 (eight) hours as needed for nausea or vomiting.   oxyCODONE 5 MG immediate release tablet Commonly known as: Roxicodone Take 1 tablet (5 mg total) by mouth every 4 (four) hours as needed.   promethazine 25 MG suppository Commonly known as: PHENERGAN Place 1 suppository (25 mg total) rectally every 6 (six) hours as needed for nausea or vomiting.        Follow-up Information     01/13/2022, MD Follow up.   Specialty: General Surgery Why: As needed.  Will call you next week for follow up. Contact information: [address] [place] [address]                 Signed: [phone] 11/13/2021, 2:03 PM

## 2021-11-13 NOTE — H&P (View-Only) (Signed)
Reason for Consult: Acute appendicitis Referring Physician: Dr. Wandra Erickson Wanda Erickson is an 26 y.o. female.  HPI: Patient is a 26 year old female who presented to the emergency room yesterday with worsening lower abdominal pain and diarrhea.  A CT scan of the abdomen pelvis revealed a dilated appendix with an appendicolith proximally consistent with early acute appendicitis.  Patient was admitted to the hospital overnight and was started on IV antibiotics.  She continues to have right lower quadrant abdominal pain.  She does have a decreased appetite.  No fever, chills.  Past Medical History:  Diagnosis Date   Asthma    "As a kid"   Miscarriage    No known health problems     Past Surgical History:  Procedure Laterality Date   DILATION AND EVACUATION N/A 11/23/2017   Procedure: DILATATION AND EVACUATION;  Surgeon: Will Bonnet, MD;  Location: [facility] ORS;  Service: Gynecology;  Laterality: N/A;   NO PAST SURGERIES      No family history on file.  Social History:  reports that she has never smoked. She has never used smokeless tobacco. She reports that she does not currently use alcohol. She reports that she does not use drugs.  Allergies:  Allergies  Allergen Reactions   Septra [Sulfamethoxazole-Trimethoprim]     Medications: I have reviewed the patient's current medications. Prior to Admission:  Medications Prior to Admission  Medication Sig Dispense Refill Last Dose   doxylamine, Sleep, (UNISOM) 25 MG tablet Take 25 mg by mouth at bedtime as needed for sleep.      Doxylamine-Pyridoxine (DICLEGIS) 10-10 MG TBEC Take 2 tablets by mouth 2 (two) times daily. (Patient not taking: Reported on 03/04/2018) 120 tablet 3    metoCLOPramide (REGLAN) 10 MG tablet Take 1 tablet (10 mg total) by mouth every 8 (eight) hours as needed for nausea or vomiting. 30 tablet 3    ondansetron (ZOFRAN ODT) 4 MG disintegrating tablet Take 1 tablet (4 mg total) by mouth every 8 (eight) hours as  needed for nausea or vomiting. 20 tablet 0    promethazine (PHENERGAN) 25 MG suppository Place 1 suppository (25 mg total) rectally every 6 (six) hours as needed for nausea or vomiting. 12 each 0    Pyridoxine HCl (VITAMIN B-6 PO) Take 1 tablet by mouth daily.       Results for orders placed or performed during the hospital encounter of 11/12/21 (from the past 48 hour(s))  Lipase, blood     Status: None   Collection Time: 11/12/21  8:12 PM  Result Value Ref Range   Lipase 36 11 - 51 U/L    Comment: Performed at [facility], [address], [place], [place] [address]  Comprehensive metabolic panel     Status: Abnormal   Collection Time: 11/12/21  8:12 PM  Result Value Ref Range   Sodium 139 135 - 145 mmol/L   Potassium 3.5 3.5 - 5.1 mmol/L   Chloride 107 98 - 111 mmol/L   CO2 23 22 - 32 mmol/L   Glucose, Bld 105 (H) 70 - 99 mg/dL    Comment: Glucose reference range applies only to samples taken after fasting for at least 8 hours.   BUN 17 6 - 20 mg/dL   Creatinine, Ser 0.81 0.44 - 1.00 mg/dL   Calcium 9.1 8.9 - 10.3 mg/dL   Total Protein 8.2 (H) 6.5 - 8.1 g/dL   Albumin 4.2 3.5 - 5.0 g/dL   AST 21 15 - 41 U/L  ALT 20 0 - 44 U/L   Alkaline Phosphatase 77 38 - 126 U/L   Total Bilirubin 0.3 0.3 - 1.2 mg/dL   GFR, Estimated >66 >06 mL/min    Comment: (NOTE) Calculated using the CKD-EPI Creatinine Equation (2021)    Anion gap 9 5 - 15    Comment: Performed at [facility], [address], [place], [place] [address]  CBC     Status: Abnormal   Collection Time: 11/12/21  8:12 PM  Result Value Ref Range   WBC 13.2 (H) 4.0 - 10.5 K/uL   RBC 4.70 3.87 - 5.11 MIL/uL   Hemoglobin 11.7 (L) 12.0 - 15.0 g/dL   HCT 10.9 32.3 - 55.7 %   MCV 80.6 80.0 - 100.0 fL   MCH 24.9 (L) 26.0 - 34.0 pg   MCHC 30.9 30.0 - 36.0 g/dL   RDW 32.2 (H) 02.5 - 42.7 %   Platelets 395 150 - 400 K/uL   nRBC 0.0 0.0 - 0.2 %    Comment: Performed at [facility], [address], [place], [place] [address]   Urinalysis, Routine w reflex microscopic Urine, Clean Catch     Status: Abnormal   Collection Time: 11/12/21 10:13 PM  Result Value Ref Range   Color, Urine YELLOW YELLOW   APPearance HAZY (A) CLEAR   Specific Gravity, Urine 1.031 (H) 1.005 - 1.030   pH 5.0 5.0 - 8.0   Glucose, UA NEGATIVE NEGATIVE mg/dL   Hgb urine dipstick NEGATIVE NEGATIVE   Bilirubin Urine NEGATIVE NEGATIVE   Ketones, ur 80 (A) NEGATIVE mg/dL   Protein, ur NEGATIVE NEGATIVE mg/dL   Nitrite NEGATIVE NEGATIVE   Leukocytes,Ua NEGATIVE NEGATIVE    Comment: Performed at [facility], [address] [place], [place] [address]  Pregnancy, urine     Status: None   Collection Time: 11/12/21 10:13 PM  Result Value Ref Range   Preg Test, Ur NEGATIVE NEGATIVE    Comment:        THE SENSITIVITY OF THIS METHODOLOGY IS >20 mIU/mL. Performed at [facility], [address], [place], [place] [address]     CT Abdomen Pelvis W Contrast  Result Date: 11/12/2021 CLINICAL DATA:  Epigastric pain EXAM: CT ABDOMEN AND PELVIS WITH CONTRAST TECHNIQUE: Multidetector CT imaging of the abdomen and pelvis was performed using the standard protocol following bolus administration of intravenous contrast. RADIATION DOSE REDUCTION: This exam was performed according to the departmental dose-optimization program which includes automated exposure control, adjustment of the mA and/or kV according to patient size and/or use of iterative reconstruction technique. CONTRAST:  OMNIPAQUE IOHEXOL 300 MG/ML  SOLN COMPARISON:  07/26/2018 FINDINGS: Lower chest: No acute abnormality Hepatobiliary: No focal hepatic abnormality. Gallbladder unremarkable. Pancreas: No focal abnormality or ductal dilatation. Spleen: No focal abnormality.  Normal size. Adrenals/Urinary Tract: No adrenal abnormality. No focal renal abnormality. No stones or hydronephrosis. Urinary bladder is unremarkable. Stomach/Bowel: Appendix is mildly dilated measuring up to 9 mm. Slight  surrounding stranding. Appendicolith noted within the proximal appendix. Findings compatible with early acute appendicitis. Stomach, large and small bowel grossly unremarkable. Vascular/Lymphatic: No evidence of aneurysm or adenopathy. Reproductive: Uterus and adnexa unremarkable.  No mass. Other: No free fluid or free air. Musculoskeletal: No acute bony abnormality. IMPRESSION: Appendicolith within the proximal appendix. Appendix is slightly dilated with slight stranding noted around the appendix. Findings compatible with early acute appendicitis. These results were called by telephone at the time of interpretation on 11/12/2021 at 11:22 pm to provider JULIE  IDOL , who verbally acknowledged these results. Electronically Signed   By: Charlett Nose M.D.   On: 11/12/2021 23:24    ROS:  Pertinent items are noted in HPI.  Blood pressure 114/75, pulse (!) 103, temperature 98.4 F (36.9 C), resp. rate 18, height 5\' 3"  (1.6 m), weight 84 kg, last menstrual period 10/13/2021, SpO2 100 %, unknown if currently breastfeeding. Physical Exam: Pleasant female no acute distress Head is normocephalic, atraumatic Lungs clear to auscultation with equal breath sounds bilaterally Heart examination reveals regular rate and rhythm without S3, S4, murmurs Abdomen is soft with tenderness in the right lower quadrant over McBurney's point.  No rigidity is noted.  CT scan images personally reviewed  Assessment/Plan: Impression: Acute appendicitis Plan: Patient will go to the operating room this morning for laparoscopic appendectomy.  The risks and benefits of the procedure including bleeding, infection, and the possibility of an open procedure were fully explained to the patient, who gave informed consent.  12/14/2021 11/13/2021, 7:06 AM

## 2021-11-13 NOTE — Anesthesia Preprocedure Evaluation (Addendum)
Anesthesia Evaluation  Patient identified by MRN, date of birth, ID band Patient awake    Reviewed: Allergy & Precautions, NPO status , Patient's Chart, lab work & pertinent test results  Airway Mallampati: II  TM Distance: >3 FB Neck ROM: Full    Dental  (+) Dental Advisory Given, Chipped,    Pulmonary asthma ,    Pulmonary exam normal breath sounds clear to auscultation       Cardiovascular negative cardio ROS Normal cardiovascular exam Rhythm:Regular Rate:Normal     Neuro/Psych negative neurological ROS  negative psych ROS   GI/Hepatic negative GI ROS, Neg liver ROS,   Endo/Other  negative endocrine ROS  Renal/GU negative Renal ROS  negative genitourinary   Musculoskeletal negative musculoskeletal ROS (+)   Abdominal   Peds negative pediatric ROS (+)  Hematology  (+) Blood dyscrasia, anemia ,   Anesthesia Other Findings   Reproductive/Obstetrics negative OB ROS                            Anesthesia Physical Anesthesia Plan  ASA: 2  Anesthesia Plan: General   Post-op Pain Management: Dilaudid IV   Induction: Intravenous  PONV Risk Score and Plan: 4 or greater and Ondansetron, Dexamethasone and Midazolam  Airway Management Planned: Oral ETT  Additional Equipment:   Intra-op Plan:   Post-operative Plan: Extubation in OR  Informed Consent: I have reviewed the patients History and Physical, chart, labs and discussed the procedure including the risks, benefits and alternatives for the proposed anesthesia with the patient or authorized representative who has indicated his/her understanding and acceptance.     Dental advisory given  Plan Discussed with: CRNA and Surgeon  Anesthesia Plan Comments:         Anesthesia Quick Evaluation

## 2021-11-13 NOTE — Subjective & Objective (Signed)
Wanda Erickson, a 26 y/o mother of two generally in good heatlh. She developed progressive abdominal pain which located toe RLQ-McBurney's point along with N/V. She presented to [facility] for evaluation.

## 2021-11-13 NOTE — Anesthesia Postprocedure Evaluation (Signed)
Anesthesia Post Note  Patient: Wanda Erickson  Procedure(s) Performed: APPENDECTOMY LAPAROSCOPIC (Abdomen)  Patient location during evaluation: PACU Anesthesia Type: General Level of consciousness: awake and alert and oriented Pain management: pain level controlled Vital Signs Assessment: post-procedure vital signs reviewed and stable Respiratory status: spontaneous breathing, nonlabored ventilation and respiratory function stable Cardiovascular status: blood pressure returned to baseline and stable Postop Assessment: no apparent nausea or vomiting Anesthetic complications: no   No notable events documented.   Last Vitals:  Vitals:   11/13/21 1100 11/13/21 1152  BP: 114/81 117/73  Pulse: (!) 113 100  Resp: 13 16  Temp:  36.7 C  SpO2: 95% 100%    Last Pain:  Vitals:   11/13/21 1152  TempSrc:   PainSc: 0-No pain                 Jenesa Foresta C Kynslee Baham

## 2021-11-13 NOTE — Interval H&P Note (Signed)
History and Physical Interval Note:  11/13/2021 8:22 AM  Wanda Erickson  has presented today for surgery, with the diagnosis of acute appendicitis.  The various methods of treatment have been discussed with the patient and family. After consideration of risks, benefits and other options for treatment, the patient has consented to  Procedure(s): APPENDECTOMY LAPAROSCOPIC (N/A) as a surgical intervention.  The patient's history has been reviewed, patient examined, no change in status, stable for surgery.  I have reviewed the patient's chart and labs.  Questions were answered to the patient's satisfaction.     [address]

## 2021-11-13 NOTE — Transfer of Care (Signed)
Immediate Anesthesia Transfer of Care Note  Patient: Wanda Erickson  Procedure(s) Performed: APPENDECTOMY LAPAROSCOPIC (Abdomen)  Patient Location: PACU  Anesthesia Type:General  Level of Consciousness: awake, alert , oriented and patient cooperative  Airway & Oxygen Therapy: Patient Spontanous Breathing  Post-op Assessment: Report given to RN and Post -op Vital signs reviewed and stable  Post vital signs: Reviewed and stable  Last Vitals:  Vitals Value Taken Time  BP 114/81 11/13/21 1100  Temp 37 C 11/13/21 1057  Pulse 113 11/13/21 1100  Resp 13 11/13/21 1100  SpO2 95 % 11/13/21 1100  Vitals shown include unvalidated device data.  Last Pain:  Vitals:   11/13/21 0745  TempSrc: Oral  PainSc:          Complications: No notable events documented.

## 2021-11-13 NOTE — Progress Notes (Signed)
--   I was notified by general surgeon Dr. Lovell Sheehan... That patient has been transferred to the general surgery service as of 11/13/2021 - Patient was neither seen nor evaluated by hospitalist service on 11/13/2021 - General surgery will reconsult hospitalist service if the need arises - This is a no charge note Shon Hale, MD

## 2021-11-13 NOTE — H&P (Signed)
History and Physical    Wanda Erickson BHA:[id number] DOB: December 17, 1995 DOA: 11/12/2021  DOS: the patient was seen and examined on 11/12/2021  PCP: Wanda Erickson   Patient coming from: Home  I have personally briefly reviewed patient's old medical records in [facility] Link  Wanda Erickson, a 26 y/o mother of two generally in good heatlh. She developed progressive abdominal pain which located toe RLQ-McBurney's point along with N/V. She presented to [facility] for evaluation.   ED Course: Afebrile, VSS. Patient was in a lot of pain at presentation. Exam with marked tenderness to palpation RLQ. Leukocytosis on lab. CT positive of appendicitis. Zosyn started. EDP discussed with general surgeon - Dr. Lovell Sheehan, who recommend [facility] admit.  Review of Systems:  Review of Systems  Constitutional: Negative.   Eyes: Negative.   Respiratory: Negative.    Cardiovascular: Negative.   Gastrointestinal:  Positive for abdominal pain, diarrhea, nausea and vomiting.  Genitourinary: Negative.   Musculoskeletal: Negative.   Skin: Negative.   Neurological: Negative.   Endo/Heme/Allergies: Negative.   Psychiatric/Behavioral: Negative.      Past Medical History:  Diagnosis Date   Asthma    "As a kid"   Miscarriage    No known health problems     Past Surgical History:  Procedure Laterality Date   DILATION AND EVACUATION N/A 11/23/2017   Procedure: DILATATION AND EVACUATION;  Surgeon: Conard Novak, MD;  Location: [facility] ORS;  Service: Gynecology;  Laterality: N/A;   NO PAST SURGERIES      Soc Hx - married. Has a 71 y/o son and 29 month old daughter. Home maker. Marriage in good health. In-laws nearby to assist in child care.   reports that she has never smoked. She has never used smokeless tobacco. She reports that she does not currently use alcohol. She reports that she does not use drugs.  Allergies  Allergen Reactions   Septra [Sulfamethoxazole-Trimethoprim]     No family history on  file.  Prior to Admission medications   Medication Sig Start Date End Date Taking? Authorizing Provider  doxylamine, Sleep, (UNISOM) 25 MG tablet Take 25 mg by mouth at bedtime as needed for sleep.    [provider]  Doxylamine-Pyridoxine (DICLEGIS) 10-10 MG TBEC Take 2 tablets by mouth 2 (two) times daily. Patient not taking: Reported on 03/04/2018 02/21/18   Gilda Crease, MD  metoCLOPramide (REGLAN) 10 MG tablet Take 1 tablet (10 mg total) by mouth every 8 (eight) hours as needed for nausea or vomiting. 02/21/18   Gilda Crease, MD  ondansetron (ZOFRAN ODT) 4 MG disintegrating tablet Take 1 tablet (4 mg total) by mouth every 8 (eight) hours as needed for nausea or vomiting. 04/20/18   McDonald, Mia A, PA-C  promethazine (PHENERGAN) 25 MG suppository Place 1 suppository (25 mg total) rectally every 6 (six) hours as needed for nausea or vomiting. 04/20/18   McDonald, Mia A, PA-C  Pyridoxine HCl (VITAMIN B-6 PO) Take 1 tablet by mouth daily.    [provider]    Physical Exam: Vitals:   11/13/21 0100 11/13/21 0130 11/13/21 0200 11/13/21 0330  BP: 124/79 122/75 125/78 122/77  Pulse: 93 94 94 93  Resp: 18 18 18 14   Temp: 98.2 F (36.8 C)     TempSrc:      SpO2: 97% 96% 98% 98%  Weight:      Height:        Physical Exam Vitals and nursing note reviewed.  Constitutional:  Appearance: She is well-developed.     Comments: Moderately overweight  HENT:     Head: Normocephalic and atraumatic.     Mouth/Throat:     Mouth: Mucous membranes are moist.     Pharynx: Oropharynx is clear.  Eyes:     Extraocular Movements: Extraocular movements intact.     Pupils: Pupils are equal, round, and reactive to light.  Cardiovascular:     Rate and Rhythm: Regular rhythm. Tachycardia present.  Pulmonary:     Effort: Pulmonary effort is normal.     Breath sounds: Normal breath sounds.  Abdominal:     General: Abdomen is protuberant. Bowel sounds are  decreased. There is no abdominal bruit.     Palpations: Abdomen is soft.     Tenderness: There is abdominal tenderness in the right lower quadrant. There is guarding. Positive signs include McBurney's sign.     Hernia: No hernia is present.  Skin:    General: Skin is warm and dry.  Neurological:     General: No focal deficit present.     Mental Status: She is alert and oriented to person, place, and time.  Psychiatric:        Mood and Affect: Mood normal.        Behavior: Behavior normal.      Labs on Admission: I have personally reviewed following labs and imaging studies  CBC: Recent Labs  Lab 11/12/21 2012  WBC 13.2*  HGB 11.7*  HCT 37.9  MCV 80.6  PLT 395   Basic Metabolic Panel: Recent Labs  Lab 11/12/21 2012  NA 139  K 3.5  CL 107  CO2 23  GLUCOSE 105*  BUN 17  CREATININE 0.81  CALCIUM 9.1   GFR: Estimated Creatinine Clearance: 103.5 mL/min (by C-G formula based on SCr of 0.81 mg/dL). Liver Function Tests: Recent Labs  Lab 11/12/21 2012  AST 21  ALT 20  ALKPHOS 77  BILITOT 0.3  PROT 8.2*  ALBUMIN 4.2   Recent Labs  Lab 11/12/21 2012  LIPASE 36   No results for input(s): "AMMONIA" in the last 168 hours. Coagulation Profile: No results for input(s): "INR", "PROTIME" in the last 168 hours. Cardiac Enzymes: No results for input(s): "CKTOTAL", "CKMB", "CKMBINDEX", "TROPONINI" in the last 168 hours. BNP (last 3 results) No results for input(s): "PROBNP" in the last 8760 hours. HbA1C: No results for input(s): "HGBA1C" in the last 72 hours. CBG: No results for input(s): "GLUCAP" in the last 168 hours. Lipid Profile: No results for input(s): "CHOL", "HDL", "LDLCALC", "TRIG", "CHOLHDL", "LDLDIRECT" in the last 72 hours. Thyroid Function Tests: No results for input(s): "TSH", "T4TOTAL", "FREET4", "T3FREE", "THYROIDAB" in the last 72 hours. Anemia Panel: No results for input(s): "VITAMINB12", "FOLATE", "FERRITIN", "TIBC", "IRON", "RETICCTPCT" in the  last 72 hours. Urine analysis:    Component Value Date/Time   COLORURINE YELLOW 11/12/2021 2213   APPEARANCEUR HAZY (A) 11/12/2021 2213   LABSPEC 1.031 (H) 11/12/2021 2213   PHURINE 5.0 11/12/2021 2213   GLUCOSEU NEGATIVE 11/12/2021 2213   HGBUR NEGATIVE 11/12/2021 2213   BILIRUBINUR NEGATIVE 11/12/2021 2213   KETONESUR 80 (A) 11/12/2021 2213   PROTEINUR NEGATIVE 11/12/2021 2213   NITRITE NEGATIVE 11/12/2021 2213   LEUKOCYTESUR NEGATIVE 11/12/2021 2213    Radiological Exams on Admission: I have personally reviewed images CT Abdomen Pelvis W Contrast  Result Date: 11/12/2021 CLINICAL DATA:  Epigastric pain EXAM: CT ABDOMEN AND PELVIS WITH CONTRAST TECHNIQUE: Multidetector CT imaging of the abdomen and pelvis was performed  using the standard protocol following bolus administration of intravenous contrast. RADIATION DOSE REDUCTION: This exam was performed according to the departmental dose-optimization program which includes automated exposure control, adjustment of the mA and/or kV according to patient size and/or use of iterative reconstruction technique. CONTRAST:  OMNIPAQUE IOHEXOL 300 MG/ML  SOLN COMPARISON:  07/26/2018 FINDINGS: Lower chest: No acute abnormality Hepatobiliary: No focal hepatic abnormality. Gallbladder unremarkable. Pancreas: No focal abnormality or ductal dilatation. Spleen: No focal abnormality.  Normal size. Adrenals/Urinary Tract: No adrenal abnormality. No focal renal abnormality. No stones or hydronephrosis. Urinary bladder is unremarkable. Stomach/Bowel: Appendix is mildly dilated measuring up to 9 mm. Slight surrounding stranding. Appendicolith noted within the proximal appendix. Findings compatible with early acute appendicitis. Stomach, large and small bowel grossly unremarkable. Vascular/Lymphatic: No evidence of aneurysm or adenopathy. Reproductive: Uterus and adnexa unremarkable.  No mass. Other: No free fluid or free air. Musculoskeletal: No acute bony  abnormality. IMPRESSION: Appendicolith within the proximal appendix. Appendix is slightly dilated with slight stranding noted around the appendix. Findings compatible with early acute appendicitis. These results were called by telephone at the time of interpretation on 11/12/2021 at 11:22 pm to provider JULIE IDOL , who verbally acknowledged these results. Electronically Signed   By: Charlett Nose M.D.   On: 11/12/2021 23:24    EKG: I have personally reviewed EKG: no EKG on chart  Assessment/Plan Active Problems:   Appendicitis    Assessment and Plan: Appendicitis Patient presented with abdominal pain, severe, at RLQ with N/V. Lab revealed leukocytosis to 13.2. CT revealed swollen appendix with surrounding stranding c/w early appendicitis. GS consulted who recommend admission by [facility] and initiation of Abx. Zosyn started.  Plan Continue Abx  Toradol 30 IV q 6 for pain mgt  GS consult - decision for medical mgt vs surgery deferred to consultant.        DVT prophylaxis: SQ Heparin Code Status: Full Code Family Communication: deferred calling spouse - he was here and spoke with ED staff  Disposition Plan: home when medically stable  Consults called: General Surgery - Dr. Lovell Sheehan  Admission status: Inpatient, Med-Surg   Illene Regulus, MD [facility] 11/13/2021, 4:56 AM

## 2021-11-13 NOTE — Anesthesia Procedure Notes (Signed)
Procedure Name: Intubation Date/Time: 11/13/2021 10:07 AM  Performed by: Alphonse Guild, RNPre-anesthesia Checklist: Patient identified, Emergency Drugs available, Suction available and Patient being monitored Patient Re-evaluated:Patient Re-evaluated prior to induction Oxygen Delivery Method: Circle System Utilized Preoxygenation: Pre-oxygenation with 100% oxygen Induction Type: IV induction and Cricoid Pressure applied Ventilation: Mask ventilation without difficulty Laryngoscope Size: Miller and 2 Grade View: Grade I Tube type: Oral Tube size: 7.0 mm Number of attempts: 1 Airway Equipment and Method: Stylet and Oral airway Placement Confirmation: ETT inserted through vocal cords under direct vision, positive ETCO2 and breath sounds checked- equal and bilateral Secured at: 22 cm Tube secured with: Tape Dental Injury: Teeth and Oropharynx as per pre-operative assessment

## 2021-11-13 NOTE — Progress Notes (Signed)
  Transition of Care [facility]) Screening Note   Patient Details  Name: Wanda Erickson Date of Birth: [birth year]   [facility] of [facility]) CM/SW Contact:    Villa Herb, LCSWA Phone Number: 11/13/2021, 11:10 AM    Transition of Care Department [facility]) has reviewed patient and no TOC needs have been identified at this time. We will continue to monitor patient advancement through interdisciplinary progression rounds. If new patient transition needs arise, please place a TOC consult.

## 2021-11-13 NOTE — Consult Note (Signed)
Reason for Consult: Acute appendicitis Referring Physician: Dr. Wandra Scot Seman is an 26 y.o. female.  HPI: Patient is a 26 year old female who presented to the emergency room yesterday with worsening lower abdominal pain and diarrhea.  A CT scan of the abdomen pelvis revealed a dilated appendix with an appendicolith proximally consistent with early acute appendicitis.  Patient was admitted to the hospital overnight and was started on IV antibiotics.  She continues to have right lower quadrant abdominal pain.  She does have a decreased appetite.  No fever, chills.  Past Medical History:  Diagnosis Date   Asthma    "As a kid"   Miscarriage    No known health problems     Past Surgical History:  Procedure Laterality Date   DILATION AND EVACUATION N/A 11/23/2017   Procedure: DILATATION AND EVACUATION;  Surgeon: Will Bonnet, MD;  Location: [facility] ORS;  Service: Gynecology;  Laterality: N/A;   NO PAST SURGERIES      No family history on file.  Social History:  reports that she has never smoked. She has never used smokeless tobacco. She reports that she does not currently use alcohol. She reports that she does not use drugs.  Allergies:  Allergies  Allergen Reactions   Septra [Sulfamethoxazole-Trimethoprim]     Medications: I have reviewed the patient's current medications. Prior to Admission:  Medications Prior to Admission  Medication Sig Dispense Refill Last Dose   doxylamine, Sleep, (UNISOM) 25 MG tablet Take 25 mg by mouth at bedtime as needed for sleep.      Doxylamine-Pyridoxine (DICLEGIS) 10-10 MG TBEC Take 2 tablets by mouth 2 (two) times daily. (Patient not taking: Reported on 03/04/2018) 120 tablet 3    metoCLOPramide (REGLAN) 10 MG tablet Take 1 tablet (10 mg total) by mouth every 8 (eight) hours as needed for nausea or vomiting. 30 tablet 3    ondansetron (ZOFRAN ODT) 4 MG disintegrating tablet Take 1 tablet (4 mg total) by mouth every 8 (eight) hours as  needed for nausea or vomiting. 20 tablet 0    promethazine (PHENERGAN) 25 MG suppository Place 1 suppository (25 mg total) rectally every 6 (six) hours as needed for nausea or vomiting. 12 each 0    Pyridoxine HCl (VITAMIN B-6 PO) Take 1 tablet by mouth daily.       Results for orders placed or performed during the hospital encounter of 11/12/21 (from the past 48 hour(s))  Lipase, blood     Status: None   Collection Time: 11/12/21  8:12 PM  Result Value Ref Range   Lipase 36 11 - 51 U/L    Comment: Performed at [facility], [address] [place], [place] [address]  Comprehensive metabolic panel     Status: Abnormal   Collection Time: 11/12/21  8:12 PM  Result Value Ref Range   Sodium 139 135 - 145 mmol/L   Potassium 3.5 3.5 - 5.1 mmol/L   Chloride 107 98 - 111 mmol/L   CO2 23 22 - 32 mmol/L   Glucose, Bld 105 (H) 70 - 99 mg/dL    Comment: Glucose reference range applies only to samples taken after fasting for at least 8 hours.   BUN 17 6 - 20 mg/dL   Creatinine, Ser 0.81 0.44 - 1.00 mg/dL   Calcium 9.1 8.9 - 10.3 mg/dL   Total Protein 8.2 (H) 6.5 - 8.1 g/dL   Albumin 4.2 3.5 - 5.0 g/dL   AST 21 15 - 41 U/L  ALT 20 0 - 44 U/L   Alkaline Phosphatase 77 38 - 126 U/L   Total Bilirubin 0.3 0.3 - 1.2 mg/dL   GFR, Estimated >66 >06 mL/min    Comment: (NOTE) Calculated using the CKD-EPI Creatinine Equation (2021)    Anion gap 9 5 - 15    Comment: Performed at [facility], [address], [place], [place] [address]  CBC     Status: Abnormal   Collection Time: 11/12/21  8:12 PM  Result Value Ref Range   WBC 13.2 (H) 4.0 - 10.5 K/uL   RBC 4.70 3.87 - 5.11 MIL/uL   Hemoglobin 11.7 (L) 12.0 - 15.0 g/dL   HCT 10.9 32.3 - 55.7 %   MCV 80.6 80.0 - 100.0 fL   MCH 24.9 (L) 26.0 - 34.0 pg   MCHC 30.9 30.0 - 36.0 g/dL   RDW 32.2 (H) 02.5 - 42.7 %   Platelets 395 150 - 400 K/uL   nRBC 0.0 0.0 - 0.2 %    Comment: Performed at [facility], [address], [place], [place] [address]   Urinalysis, Routine w reflex microscopic Urine, Clean Catch     Status: Abnormal   Collection Time: 11/12/21 10:13 PM  Result Value Ref Range   Color, Urine YELLOW YELLOW   APPearance HAZY (A) CLEAR   Specific Gravity, Urine 1.031 (H) 1.005 - 1.030   pH 5.0 5.0 - 8.0   Glucose, UA NEGATIVE NEGATIVE mg/dL   Hgb urine dipstick NEGATIVE NEGATIVE   Bilirubin Urine NEGATIVE NEGATIVE   Ketones, ur 80 (A) NEGATIVE mg/dL   Protein, ur NEGATIVE NEGATIVE mg/dL   Nitrite NEGATIVE NEGATIVE   Leukocytes,Ua NEGATIVE NEGATIVE    Comment: Performed at [facility], [address] [place], [place] [address]  Pregnancy, urine     Status: None   Collection Time: 11/12/21 10:13 PM  Result Value Ref Range   Preg Test, Ur NEGATIVE NEGATIVE    Comment:        THE SENSITIVITY OF THIS METHODOLOGY IS >20 mIU/mL. Performed at [facility], [address], [place], [place] [address]     CT Abdomen Pelvis W Contrast  Result Date: 11/12/2021 CLINICAL DATA:  Epigastric pain EXAM: CT ABDOMEN AND PELVIS WITH CONTRAST TECHNIQUE: Multidetector CT imaging of the abdomen and pelvis was performed using the standard protocol following bolus administration of intravenous contrast. RADIATION DOSE REDUCTION: This exam was performed according to the departmental dose-optimization program which includes automated exposure control, adjustment of the mA and/or kV according to patient size and/or use of iterative reconstruction technique. CONTRAST:  OMNIPAQUE IOHEXOL 300 MG/ML  SOLN COMPARISON:  07/26/2018 FINDINGS: Lower chest: No acute abnormality Hepatobiliary: No focal hepatic abnormality. Gallbladder unremarkable. Pancreas: No focal abnormality or ductal dilatation. Spleen: No focal abnormality.  Normal size. Adrenals/Urinary Tract: No adrenal abnormality. No focal renal abnormality. No stones or hydronephrosis. Urinary bladder is unremarkable. Stomach/Bowel: Appendix is mildly dilated measuring up to 9 mm. Slight  surrounding stranding. Appendicolith noted within the proximal appendix. Findings compatible with early acute appendicitis. Stomach, large and small bowel grossly unremarkable. Vascular/Lymphatic: No evidence of aneurysm or adenopathy. Reproductive: Uterus and adnexa unremarkable.  No mass. Other: No free fluid or free air. Musculoskeletal: No acute bony abnormality. IMPRESSION: Appendicolith within the proximal appendix. Appendix is slightly dilated with slight stranding noted around the appendix. Findings compatible with early acute appendicitis. These results were called by telephone at the time of interpretation on 11/12/2021 at 11:22 pm to provider JULIE  IDOL , who verbally acknowledged these results. Electronically Signed   By: Charlett Nose M.D.   On: 11/12/2021 23:24    ROS:  Pertinent items are noted in HPI.  Blood pressure 114/75, pulse (!) 103, temperature 98.4 F (36.9 C), resp. rate 18, height 5\' 3"  (1.6 m), weight 84 kg, last menstrual period 10/13/2021, SpO2 100 %, unknown if currently breastfeeding. Physical Exam: Pleasant female no acute distress Head is normocephalic, atraumatic Lungs clear to auscultation with equal breath sounds bilaterally Heart examination reveals regular rate and rhythm without S3, S4, murmurs Abdomen is soft with tenderness in the right lower quadrant over McBurney's point.  No rigidity is noted.  CT scan images personally reviewed  Assessment/Plan: Impression: Acute appendicitis Plan: Patient will go to the operating room this morning for laparoscopic appendectomy.  The risks and benefits of the procedure including bleeding, infection, and the possibility of an open procedure were fully explained to the patient, who gave informed consent.  12/14/2021 11/13/2021, 7:06 AM

## 2021-11-13 NOTE — Op Note (Signed)
Patient:  Wanda Erickson  DOB:  July 15, 1995  MRN:  [id number]   Preop Diagnosis: Acute appendicitis  Postop Diagnosis: Same  Procedure: Laparoscopic appendectomy  Surgeon: Franky Macho, MD  Anes: General endotracheal  Indications: Patient is a 26 year old female who presents with acute appendicitis.  The risks and benefits of the procedure including bleeding, infection, and the possibility of an open procedure were fully explained to the patient, who gave informed consent.  Procedure note: The patient was placed in the supine position.  After induction of general endotracheal anesthesia, the abdomen was prepped and draped using usual sterile technique with ChloraPrep.  Surgical site confirmation was performed.  A supraumbilical incision was made down to the fascia.  A Veress needle was introduced into the abdominal cavity and confirmation of placement was done using the saline drop test.  The abdomen was then insufflated to 15 mmHg pressure.  An 11 mm trocar was introduced into the abdominal cavity under direct visualization without difficulty.  The patient was placed in deeper Trendelenburg position and an additional 12 mm trocar was placed in the suprapubic region and a 5 mm trocar was placed in the left lower quadrant region.  The appendix was visualized and the distal three quarters was swollen and injected.  The mesoappendix was divided using the harmonic scalpel.  A vascular load Endo GIA was placed across the base of the appendix and fired.  The appendix was then removed using an Endo Catch bag without difficulty.  The staple line was inspected and noted to be within normal limits.  All fluid and air were then evacuated from the abdominal cavity prior to the removal of the trocars.  All wounds were irrigated with normal saline.  All wounds were injected with Exparel.  The supraumbilical fascia was reapproximated using an 0 Vicryl interrupted suture.  All skin incisions were closed using  a 4-0 Monocryl subcuticular suture.  Dermabond was applied.  All tape and needle counts were correct at the end of the procedure.  The patient was extubated in the operating room and transferred to PACU in stable condition.  Complications: None  EBL: Minimal  Specimen: Appendix

## 2021-11-13 NOTE — Assessment & Plan Note (Signed)
Patient presented with abdominal pain, severe, at RLQ with N/V. Lab revealed leukocytosis to 13.2. CT revealed swollen appendix with surrounding stranding c/w early appendicitis. GS consulted who recommend admission by [facility] and initiation of Abx. Zosyn started.  Plan Continue Abx  Toradol 30 IV q 6 for pain mgt  GS consult - decision for medical mgt vs surgery deferred to consultant.

## 2021-11-14 ENCOUNTER — Encounter (HOSPITAL_COMMUNITY): Payer: Self-pay | Admitting: General Surgery

## 2021-11-14 LAB — SURGICAL PATHOLOGY

## 2022-01-09 ENCOUNTER — Emergency Department (HOSPITAL_COMMUNITY): Payer: Medicaid Other

## 2022-01-09 ENCOUNTER — Encounter (HOSPITAL_COMMUNITY): Payer: Self-pay | Admitting: *Deleted

## 2022-01-09 ENCOUNTER — Other Ambulatory Visit: Payer: Self-pay

## 2022-01-09 ENCOUNTER — Emergency Department (HOSPITAL_COMMUNITY)
Admission: EM | Admit: 2022-01-09 | Discharge: 2022-01-09 | Disposition: A | Discharge status: Home / Self Care | Payer: Medicaid Other | Attending: Emergency Medicine | Admitting: Emergency Medicine

## 2022-01-09 DIAGNOSIS — M25571 Pain in right ankle and joints of right foot: Secondary | ICD-10-CM | POA: Insufficient documentation

## 2022-01-09 DIAGNOSIS — J45909 Unspecified asthma, uncomplicated: Secondary | ICD-10-CM | POA: Insufficient documentation

## 2022-01-09 DIAGNOSIS — R000 Tachycardia, unspecified: Secondary | ICD-10-CM | POA: Diagnosis not present

## 2022-01-09 DIAGNOSIS — Y9389 Activity, other specified: Secondary | ICD-10-CM | POA: Insufficient documentation

## 2022-01-09 DIAGNOSIS — X500XXA Overexertion from strenuous movement or load, initial encounter: Secondary | ICD-10-CM | POA: Diagnosis not present

## 2022-01-09 MED ORDER — IBUPROFEN 400 MG PO TABS
600.0000 mg | ORAL_TABLET | Freq: Once | ORAL | Status: AC
  Administered 2022-01-09: 600 mg via ORAL
  Filled 2022-01-09: qty 2

## 2022-01-09 NOTE — ED Triage Notes (Signed)
Pt states she was carrying her baby last night and tripped causing her to twist her right ankle; pt has swelling to the ankle but able to bear weight

## 2022-01-09 NOTE — Discharge Instructions (Addendum)
Note the work-up today was overall consistent with ankle sprain.  Your x-ray was negative for any acute fracture or dislocation.  We will treat this with a lace up brace for added support.  Try to wear athletic/tennis shoes instead of crocs or flip-flops for added support.  Continue at home ice, elevation and NSAIDs such as ibuprofen for pain/inflammation.  Recommend follow-up with orthopedics in 7 to 10 days for reevaluation of symptoms.  Please not hesitate to return to emergency department for worrisome signs and symptoms we discussed become apparent.

## 2022-01-09 NOTE — ED Provider Notes (Signed)
[facility] EMERGENCY DEPARTMENT Provider Note   CSN: [id number] Arrival date & time: 01/09/22  1040     History  Chief Complaint  Patient presents with   Ankle Pain    Wanda Erickson is a 26 y.o. female.   Ankle Pain   26 year old female presents emergency department complaints of right ankle pain.  Patient states he was walking down the steps yesterday in crocs and inverted her right ankle falling to the ground.  She denies any concerning trauma elsewhere.  She states he has been able to ambulate since the incident but with pain.  She states the fall was mechanical as she was distracted and not watching where she was walking.  Denies any previous surgeries to affected ankle.  Denies weakness or sensory deficits in affected foot.  Denies any known cuts or abrasions or lacerations secondary to injury.  Past medical history significant for asthma  Home Medications Prior to Admission medications   Medication Sig Start Date End Date Taking? Authorizing Provider  doxylamine, Sleep, (UNISOM) 25 MG tablet Take 25 mg by mouth at bedtime as needed for sleep.    [provider]  Doxylamine-Pyridoxine (DICLEGIS) 10-10 MG TBEC Take 2 tablets by mouth 2 (two) times daily. Patient not taking: Reported on 03/04/2018 02/21/18   Gilda Crease, MD  metoCLOPramide (REGLAN) 10 MG tablet Take 1 tablet (10 mg total) by mouth every 8 (eight) hours as needed for nausea or vomiting. Patient not taking: Reported on 11/13/2021 02/21/18   Gilda Crease, MD  ondansetron (ZOFRAN ODT) 4 MG disintegrating tablet Take 1 tablet (4 mg total) by mouth every 8 (eight) hours as needed for nausea or vomiting. Patient not taking: Reported on 11/13/2021 04/20/18   McDonald, Mia A, PA-C  oxyCODONE (ROXICODONE) 5 MG immediate release tablet Take 1 tablet (5 mg total) by mouth every 4 (four) hours as needed. 11/13/21 11/13/22  Franky Macho, MD  promethazine (PHENERGAN) 25 MG suppository Place 1  suppository (25 mg total) rectally every 6 (six) hours as needed for nausea or vomiting. Patient not taking: Reported on 11/13/2021 04/20/18   Frederik Pear A, PA-C      Allergies    Septra [sulfamethoxazole-trimethoprim]    Review of Systems   Review of Systems  All other systems reviewed and are negative.   Physical Exam Updated Vital Signs BP 127/81 (BP Location: Left Arm)   Pulse (!) 105   Temp 98.2 F (36.8 C) (Oral)   Resp 18   LMP 01/07/2022 (Exact Date)   SpO2 99%  Physical Exam Vitals and nursing note reviewed.  Constitutional:      General: She is not in acute distress.    Appearance: She is well-developed.  HENT:     Head: Normocephalic and atraumatic.  Eyes:     Conjunctiva/sclera: Conjunctivae normal.  Cardiovascular:     Rate and Rhythm: Normal rate and regular rhythm.     Heart sounds: No murmur heard. Pulmonary:     Effort: Pulmonary effort is normal. No respiratory distress.     Breath sounds: Normal breath sounds. No wheezing or rales.  Abdominal:     Palpations: Abdomen is soft.     Tenderness: There is no abdominal tenderness.  Musculoskeletal:        General: No swelling.     Cervical back: Neck supple.     Comments: No obvious breaks in skin noted.  Patient has full range of motion of right ankle in dorsi/plantarflexion.  Patient has  full range of motion of digits.  Muscle strength 5 out of 5 for bilateral ankles.  Dorsalis pedis pulses full and intact bilaterally.  Patient has tenderness to palpation over area of ATFL.  No posterior medial/lateral malleolus tenderness, tenderness to palpation of base of fifth metatarsal.  Patient has full range of motion of bilateral knees with no associated tenderness to palpation.  Skin:    General: Skin is warm and dry.     Capillary Refill: Capillary refill takes less than 2 seconds.  Neurological:     Mental Status: She is alert.  Psychiatric:        Mood and Affect: Mood normal.     ED Results /  Procedures / Treatments   Labs (all labs ordered are listed, but only abnormal results are displayed) Labs Reviewed - No data to display  EKG None  Radiology DG Ankle Complete Right  Result Date: 01/09/2022 CLINICAL DATA:  [place].  Injured right ankle. EXAM: RIGHT ANKLE - COMPLETE 3+ VIEW COMPARISON:  None Available. FINDINGS: The ankle mortise is maintained. No acute ankle fracture is identified. No osteochondral lesion. No ankle joint effusion. The mid and hindfoot bony structures are intact. IMPRESSION: No acute bony findings. Electronically Signed   By: Marijo Sanes M.D.   On: 01/09/2022 11:34    Procedures Procedures    Medications Ordered in ED Medications  ibuprofen (ADVIL) tablet 600 mg (600 mg Oral Given 01/09/22 1124)    ED Course/ Medical Decision Making/ A&P                           Medical Decision Making Risk Prescription drug management.   This patient presents to the ED for concern of ankle pain, this involves an extensive number of treatment options, and is a complaint that carries with it a high risk of complications and morbidity.  The differential diagnosis includes fracture, strain/sprain, dislocation   Co morbidities that complicate the patient evaluation  See HPI   Additional history obtained:  Additional history obtained from EMR External records from outside source obtained and reviewed including hospital records   Lab Tests:  N/a   Imaging Studies ordered:  I ordered imaging studies including right ankle x-ray I independently visualized and interpreted imaging which showed no acute abnormalities. I agree with the radiologist interpretation   Cardiac Monitoring: / EKG:  The patient was maintained on a cardiac monitor.  I personally viewed and interpreted the cardiac monitored which showed an underlying rhythm of: Sinus rhythm   Consultations Obtained:  N/a   Problem List / ED Course / Critical interventions / Medication  management  Right ankle pain I ordered medication including ibuprofen for pain   Reevaluation of the patient after these medicines showed that the patient improved I have reviewed the patients home medicines and have made adjustments as needed   Social Determinants of Health:  Denies tobacco, illicit drug use   Test / Admission - Considered:  Right ankle pain  Vitals signs significant for mild tachycardia with a rate of 105 initially which decreased administration of ibuprofen as well as application of ice; likely secondary to patient's pain.. Otherwise within normal range and stable throughout visit. Imaging studies significant for: See above Patient symptoms likely secondary to right ankle sprain of ATFL.  No obvious laxity noted on exam.  X-ray negative for any acute bony abnormalities.  Patient given supportive lace up ankle brace.  Symptomatic therapy recommended with rest,  ice, elevation and NSAIDs for pain/inflammation.  Close follow-up with PCP recommended 3 to 5 days for reevaluation.  Treatment plan discussed along with patient she acknowledged understand was agreeable to said plan. Worrisome signs and symptoms were discussed with the patient, and the patient acknowledged understanding to return to the ED if noticed. Patient was stable upon discharge.          Final Clinical Impression(s) / ED Diagnoses Final diagnoses:  Acute right ankle pain    Rx / DC Orders ED Discharge Orders     None         Peter Garter, [place] 01/09/22 1143    Terrilee Files, MD 01/09/22 1859

## 2022-10-09 ENCOUNTER — Encounter (HOSPITAL_COMMUNITY): Payer: Self-pay | Admitting: Emergency Medicine

## 2022-10-09 ENCOUNTER — Emergency Department (HOSPITAL_COMMUNITY): Payer: Medicaid Other

## 2022-10-09 ENCOUNTER — Emergency Department (HOSPITAL_COMMUNITY)
Admission: EM | Admit: 2022-10-09 | Discharge: 2022-10-09 | Disposition: A | Discharge status: Home / Self Care | Payer: Medicaid Other | Attending: Emergency Medicine | Admitting: Emergency Medicine

## 2022-10-09 ENCOUNTER — Other Ambulatory Visit: Payer: Self-pay

## 2022-10-09 DIAGNOSIS — S93401A Sprain of unspecified ligament of right ankle, initial encounter: Secondary | ICD-10-CM | POA: Insufficient documentation

## 2022-10-09 DIAGNOSIS — M25571 Pain in right ankle and joints of right foot: Secondary | ICD-10-CM | POA: Diagnosis present

## 2022-10-09 DIAGNOSIS — X501XXA Overexertion from prolonged static or awkward postures, initial encounter: Secondary | ICD-10-CM | POA: Insufficient documentation

## 2022-10-09 MED ORDER — IBUPROFEN 800 MG PO TABS: 800.0000 mg | ORAL_TABLET | Freq: Three times a day (TID) | ORAL | 0 refills | Status: DC

## 2022-10-09 NOTE — Discharge Instructions (Signed)
Elevate and apply ice packs on and off to your ankle.  Use crutches for weightbearing.  You have been prescribed ibuprofen that you may take to help with pain.  Contact the orthopedic provider listed in 1 week to arrange follow-up appointment if your symptoms or not improving.

## 2022-10-09 NOTE — ED Triage Notes (Signed)
Pt via POV c/o right ankle pain after rolling her ankle yesterday in a parking lot. Ankle appears swollen and pt is ambulatory but with an obvious limp. No meds PTA.

## 2022-10-09 NOTE — ED Provider Notes (Signed)
[place] EMERGENCY DEPARTMENT AT [facility] Provider Note   CSN: [id number] Arrival date & time: 10/09/22  1031     History  Chief Complaint  Patient presents with   Ankle Pain    Wanda Erickson is a 27 y.o. female.   Ankle Pain Associated symptoms: no fever        Wanda Erickson is a 27 y.o. female who presents to the Emergency Department complaining of right ankle pain and swelling x 1 day.  States that she twisted her ankle yesterday.  No reported fall.  She noticed a pop to her ankle and immediate swelling to the lateral aspect.  Able to bear weight but not fully.  Pain radiates to lower calf area.  She denies numbness or tingling or open wound.   Home Medications Prior to Admission medications   Medication Sig Start Date End Date Taking? Authorizing Provider  ibuprofen (ADVIL) 800 MG tablet Take 1 tablet (800 mg total) by mouth 3 (three) times daily. Take with food 10/09/22  Yes Brittinie Wherley, PA-C  doxylamine, Sleep, (UNISOM) 25 MG tablet Take 25 mg by mouth at bedtime as needed for sleep.    [provider]  Doxylamine-Pyridoxine (DICLEGIS) 10-10 MG TBEC Take 2 tablets by mouth 2 (two) times daily. Patient not taking: Reported on 03/04/2018 02/21/18   Gilda Crease, MD  metoCLOPramide (REGLAN) 10 MG tablet Take 1 tablet (10 mg total) by mouth every 8 (eight) hours as needed for nausea or vomiting. Patient not taking: Reported on 11/13/2021 02/21/18   Gilda Crease, MD  ondansetron (ZOFRAN ODT) 4 MG disintegrating tablet Take 1 tablet (4 mg total) by mouth every 8 (eight) hours as needed for nausea or vomiting. Patient not taking: Reported on 11/13/2021 04/20/18   McDonald, Mia A, PA-C  oxyCODONE (ROXICODONE) 5 MG immediate release tablet Take 1 tablet (5 mg total) by mouth every 4 (four) hours as needed. 11/13/21 11/13/22  Franky Macho, MD  promethazine (PHENERGAN) 25 MG suppository Place 1 suppository (25 mg total) rectally every 6  (six) hours as needed for nausea or vomiting. Patient not taking: Reported on 11/13/2021 04/20/18   McDonald, Pedro Earls A, PA-C      Allergies    Septra [sulfamethoxazole-trimethoprim]    Review of Systems   Review of Systems  Constitutional:  Negative for fever.  HENT:  Negative for congestion.   Respiratory:  Negative for shortness of breath.   Cardiovascular:  Negative for leg swelling.  Musculoskeletal:  Positive for arthralgias (Right ankle pain and swelling).  Skin:  Negative for color change and rash.  Neurological:  Negative for weakness and numbness.    Physical Exam Updated Vital Signs BP (!) 128/90 (BP Location: Right Arm)   Pulse (!) 101   Temp 98.7 F (37.1 C) (Oral)   Resp 20   Ht 5\' 3"  (1.6 m)   Wt 83.9 kg   LMP 10/08/2022 (Exact Date)   SpO2 99%   Breastfeeding No   BMI 32.77 kg/m  Physical Exam Vitals and nursing note reviewed.  Constitutional:      Appearance: Normal appearance.  HENT:     Head: Atraumatic.  Cardiovascular:     Rate and Rhythm: Normal rate and regular rhythm.     Pulses: Normal pulses.  Pulmonary:     Effort: Pulmonary effort is normal.  Musculoskeletal:        General: Swelling, tenderness and signs of injury present. No deformity.     Right ankle:  Swelling present. No deformity, ecchymosis or lacerations. Tenderness present over the lateral malleolus. Decreased range of motion.     Right Achilles Tendon: Normal. Thompson's test negative.     Comments: Tenderness palpation the lateral aspect of the right ankle.  Mild to moderate edema noted.  No bony deformities or obvious instability.  No open wound.  Compartments are soft.  Right Achilles tendon appears intact.  Skin:    General: Skin is warm.     Capillary Refill: Capillary refill takes less than 2 seconds.  Neurological:     General: No focal deficit present.     Mental Status: She is alert.     Sensory: No sensory deficit.     Motor: No weakness.     ED Results / Procedures /  Treatments   Labs (all labs ordered are listed, but only abnormal results are displayed) Labs Reviewed - No data to display  EKG None  Radiology DG Ankle Complete Right  Result Date: 10/09/2022 CLINICAL DATA:  Pain after trauma EXAM: RIGHT ANKLE - COMPLETE 3 VIEW COMPARISON:  01/09/2022 FINDINGS: Soft tissue swelling. No fracture or dislocation. Preserved joint spaces and bone mineralization. IMPRESSION: Soft tissue swelling. Electronically Signed   By: Karen Kays M.D.   On: 10/09/2022 11:44    Procedures Procedures    Medications Ordered in ED Medications - No data to display  ED Course/ Medical Decision Making/ A&P                             Medical Decision Making Patient here for evaluation of right ankle pain after a twisting injury.  No reported fall.  Heard a pop to her ankle and noticed immediate swelling on the lateral aspect.  Continues to bear small amount of weight to the ankle  Differential would include but not limited to sprain, dislocation, fracture.  Clinically, I suspect this is related to sprain.  Will obtain x-ray  Amount and/or Complexity of Data Reviewed Radiology: ordered.    Details: X-ray of the ankle shows soft tissue swelling without fracture or dislocation Discussion of management or test interpretation with external provider(s): Likely sprain.  Patient agreeable to RICE therapy, ASO applied and crutches given.  She will follow-up with orthopedics in 1 week if needed.  Prescription for ibuprofen  Risk Prescription drug management.           Final Clinical Impression(s) / ED Diagnoses Final diagnoses:  Sprain of right ankle, unspecified ligament, initial encounter    Rx / DC Orders ED Discharge Orders          Ordered    ibuprofen (ADVIL) 800 MG tablet  3 times daily        10/09/22 1200              Pauline Aus, PA-C 10/09/22 1205    Rondel Baton, MD 10/11/22 1925

## 2022-10-22 ENCOUNTER — Ambulatory Visit: Payer: Medicaid Other | Admitting: Orthopedic Surgery

## 2022-11-13 ENCOUNTER — Encounter: Payer: Medicaid Other | Admitting: Adult Health

## 2022-12-09 LAB — OB RESULTS CONSOLE HIV ANTIBODY (ROUTINE TESTING): HIV: NONREACTIVE

## 2022-12-09 LAB — OB RESULTS CONSOLE ABO/RH: RH Type: POSITIVE

## 2022-12-09 LAB — OB RESULTS CONSOLE RUBELLA ANTIBODY, IGM: Rubella: IMMUNE

## 2022-12-09 LAB — OB RESULTS CONSOLE HEPATITIS B SURFACE ANTIGEN: Hepatitis B Surface Ag: NEGATIVE

## 2022-12-09 LAB — HEPATITIS C ANTIBODY: HCV Ab: NEGATIVE

## 2022-12-09 LAB — OB RESULTS CONSOLE RPR: RPR: NONREACTIVE

## 2022-12-11 ENCOUNTER — Other Ambulatory Visit: Payer: Self-pay

## 2022-12-11 ENCOUNTER — Encounter (HOSPITAL_COMMUNITY): Payer: Self-pay

## 2022-12-11 DIAGNOSIS — O99611 Diseases of the digestive system complicating pregnancy, first trimester: Secondary | ICD-10-CM | POA: Insufficient documentation

## 2022-12-11 DIAGNOSIS — Z3A09 9 weeks gestation of pregnancy: Secondary | ICD-10-CM | POA: Insufficient documentation

## 2022-12-11 DIAGNOSIS — O26891 Other specified pregnancy related conditions, first trimester: Secondary | ICD-10-CM | POA: Insufficient documentation

## 2022-12-11 NOTE — ED Triage Notes (Signed)
_Pt arrives ambulatory to Triage with c/o right lower jaw dental pain has been able to take tylenol only as she is [redacted] weeks pregnant.

## 2022-12-12 ENCOUNTER — Emergency Department (HOSPITAL_COMMUNITY)
Admission: EM | Admit: 2022-12-12 | Discharge: 2022-12-12 | Disposition: A | Discharge status: Home / Self Care | Payer: Medicaid Other | Attending: Emergency Medicine | Admitting: Emergency Medicine

## 2022-12-12 DIAGNOSIS — Z3491 Encounter for supervision of normal pregnancy, unspecified, first trimester: Secondary | ICD-10-CM

## 2022-12-12 DIAGNOSIS — K0889 Other specified disorders of teeth and supporting structures: Secondary | ICD-10-CM

## 2022-12-12 MED ORDER — AMOXICILLIN 250 MG PO CAPS
1000.0000 mg | ORAL_CAPSULE | Freq: Once | ORAL | Status: AC
  Administered 2022-12-12: 1000 mg via ORAL
  Filled 2022-12-12: qty 4

## 2022-12-12 MED ORDER — AMOXICILLIN 500 MG PO CAPS: 1000.0000 mg | ORAL_CAPSULE | Freq: Two times a day (BID) | ORAL | 0 refills | Status: DC

## 2022-12-12 NOTE — ED Provider Notes (Signed)
Wanda Erickson EMERGENCY DEPARTMENT AT [facility] Provider Note   CSN: [id number] Arrival date & time: 12/11/22  2025     History  Chief Complaint  Patient presents with   Dental Pain    Wanda Erickson is a 27 y.o. female.  The history is provided by the patient.  Dental Pain She is currently [redacted] weeks pregnant (G4, P2, A1) and complains of pain in a right lower tooth which started yesterday.  She has been taking acetaminophen for pain without relief.  She does have a dentist but he is closed today.   Home Medications Prior to Admission medications   Medication Sig Start Date End Date Taking? Authorizing Provider  doxylamine, Sleep, (UNISOM) 25 MG tablet Take 25 mg by mouth at bedtime as needed for sleep.    [provider]  Doxylamine-Pyridoxine (DICLEGIS) 10-10 MG TBEC Take 2 tablets by mouth 2 (two) times daily. Patient not taking: Reported on 03/04/2018 02/21/18   Gilda Crease, MD  ibuprofen (ADVIL) 800 MG tablet Take 1 tablet (800 mg total) by mouth 3 (three) times daily. Take with food 10/09/22   Triplett, Tammy, PA-C  metoCLOPramide (REGLAN) 10 MG tablet Take 1 tablet (10 mg total) by mouth every 8 (eight) hours as needed for nausea or vomiting. Patient not taking: Reported on 11/13/2021 02/21/18   Gilda Crease, MD  ondansetron (ZOFRAN ODT) 4 MG disintegrating tablet Take 1 tablet (4 mg total) by mouth every 8 (eight) hours as needed for nausea or vomiting. Patient not taking: Reported on 11/13/2021 04/20/18   McDonald, Pedro Earls A, PA-C  promethazine (PHENERGAN) 25 MG suppository Place 1 suppository (25 mg total) rectally every 6 (six) hours as needed for nausea or vomiting. Patient not taking: Reported on 11/13/2021 04/20/18   Frederik Pear A, PA-C      Allergies    Septra [sulfamethoxazole-trimethoprim]    Review of Systems   Review of Systems  All other systems reviewed and are negative.   Physical Exam Updated Vital Signs BP 131/79 (BP  Location: Right Arm)   Pulse (!) 106   Temp 97.8 F (36.6 C) (Oral)   Resp 16   Ht 5\' 3"  (1.6 m)   Wt 90.3 kg   LMP 10/08/2022 (Exact Date)   SpO2 100%   BMI 35.25 kg/m  Physical Exam Vitals and nursing note reviewed.   27 year old female, resting comfortably and in no acute distress. Vital signs are normal. Oxygen saturation is 100%, which is normal. Head is normocephalic and atraumatic. PERRLA, EOMI. Tooth #27 is pushed forward and, as commended an angle relative to the other teeth.  There is some erythema around the gingiva at its root.  No obvious caries are seen.  This is the tooth that is causing her pain. Neck is nontender and supple without adenopathy. Lungs are clear without rales, wheezes, or rhonchi. Chest is nontender. Heart has regular rate and rhythm without murmur.  ED Results / Procedures / Treatments    Procedures Procedures    Medications Ordered in ED Medications  amoxicillin (AMOXIL) capsule 1,000 mg (has no administration in time range)    ED Course/ Medical Decision Making/ A&P                                 Medical Decision Making  Dental pain from a tooth that is coming in at an awkward angle.  I believe that this  will need to be pulled as I do not believe her valve has space for all of the teeth.  She will need to see her dentist.  First trimester pregnancy, patient has already made arrangements for prenatal care.  I have ordered a dose of amoxicillin and I am discharging her with a prescription for amoxicillin.  Final Clinical Impression(s) / ED Diagnoses Final diagnoses:  Pain, dental  First trimester pregnancy    Rx / DC Orders ED Discharge Orders          Ordered    amoxicillin (AMOXIL) 500 MG capsule  2 times daily        12/12/22 0416              Dione Booze, MD 12/12/22 [phone]

## 2022-12-12 NOTE — Discharge Instructions (Addendum)
You may take acetaminophen as needed for pain.  Call your dentist for an appointment to be seen as soon as possible.  It is very likely that he will need to hold that tooth that is giving you the problem.

## 2022-12-31 ENCOUNTER — Telehealth: Payer: Self-pay

## 2022-12-31 NOTE — Telephone Encounter (Signed)
Called patient back to inform her that her early 2hr GTT will be scheduled after her appointment with the doctor.Understanding was voiced. Mattheus Rauls l Kaylor Maiers, CMA

## 2022-12-31 NOTE — Telephone Encounter (Signed)
-----   Message from Madlyn Frankel sent at 12/31/2022 11:03 AM EDT ----- Regarding: New OB Called patient to schedule her nurse intake visit and she mentioned that [facility] wanted her to do an early glucose test because her A1C was elevated.   She hasn't seen them since the beginning of September because she is transferring care to [place]. She wanted to know if she needed to come in an do her early glucose test. Her lab results are in Care Everywhere.   Best contact number is [phone].

## 2023-01-12 ENCOUNTER — Telehealth: Payer: Medicaid Other

## 2023-01-19 ENCOUNTER — Telehealth: Payer: Medicaid Other

## 2023-01-19 DIAGNOSIS — Z8759 Personal history of other complications of pregnancy, childbirth and the puerperium: Secondary | ICD-10-CM

## 2023-01-19 DIAGNOSIS — O09899 Supervision of other high risk pregnancies, unspecified trimester: Secondary | ICD-10-CM | POA: Insufficient documentation

## 2023-01-19 DIAGNOSIS — Z3689 Encounter for other specified antenatal screening: Secondary | ICD-10-CM | POA: Diagnosis not present

## 2023-01-19 DIAGNOSIS — Z6835 Body mass index (BMI) 35.0-35.9, adult: Secondary | ICD-10-CM

## 2023-01-19 DIAGNOSIS — O09299 Supervision of pregnancy with other poor reproductive or obstetric history, unspecified trimester: Secondary | ICD-10-CM

## 2023-01-19 NOTE — Progress Notes (Signed)
Last pap: 2022

## 2023-01-19 NOTE — Progress Notes (Cosign Needed Addendum)
New OB intake was done via Mychart video.  Wanda Erickson, CMA

## 2023-01-26 ENCOUNTER — Ambulatory Visit: Payer: Medicaid Other

## 2023-01-26 ENCOUNTER — Other Ambulatory Visit (HOSPITAL_COMMUNITY)
Admission: RE | Admit: 2023-01-26 | Discharge: 2023-01-26 | Disposition: A | Discharge status: Home / Self Care | Payer: Medicaid Other | Source: Ambulatory Visit

## 2023-01-26 VITALS — BP 121/73 | HR 104 | Wt 204.0 lb

## 2023-01-26 DIAGNOSIS — O09899 Supervision of other high risk pregnancies, unspecified trimester: Secondary | ICD-10-CM | POA: Diagnosis present

## 2023-01-26 DIAGNOSIS — O09892 Supervision of other high risk pregnancies, second trimester: Secondary | ICD-10-CM | POA: Diagnosis not present

## 2023-01-26 DIAGNOSIS — Z5320 Procedure and treatment not carried out because of patient's decision for unspecified reasons: Secondary | ICD-10-CM

## 2023-01-26 DIAGNOSIS — O09292 Supervision of pregnancy with other poor reproductive or obstetric history, second trimester: Secondary | ICD-10-CM

## 2023-01-26 DIAGNOSIS — R7309 Other abnormal glucose: Secondary | ICD-10-CM

## 2023-01-26 DIAGNOSIS — O99212 Obesity complicating pregnancy, second trimester: Secondary | ICD-10-CM | POA: Diagnosis not present

## 2023-01-26 DIAGNOSIS — O99210 Obesity complicating pregnancy, unspecified trimester: Secondary | ICD-10-CM | POA: Insufficient documentation

## 2023-01-26 DIAGNOSIS — Z8659 Personal history of other mental and behavioral disorders: Secondary | ICD-10-CM

## 2023-01-26 DIAGNOSIS — Z3A15 15 weeks gestation of pregnancy: Secondary | ICD-10-CM

## 2023-01-26 DIAGNOSIS — O09299 Supervision of pregnancy with other poor reproductive or obstetric history, unspecified trimester: Secondary | ICD-10-CM | POA: Insufficient documentation

## 2023-01-26 NOTE — Progress Notes (Signed)
Subjective:   Wanda Erickson is a 27 y.o. D3U2025 at [redacted]w[redacted]d by Definite LMP of October 08, 2022 and confirmed by 9 wk [place] being seen today for her first obstetrical visit.  Patient states this was a planned pregnancy.  Patient reports she has taken birth control prior to conception and states husband is got get vasectomy after delivery.   Gynecological/Obstetrical History: Patient reports no history of gynecological surgeries, but states she had a D&C with miscarriage in 2019.  Patient denies history of abnormal pap smears. She reports last pap was June 2022.  Pregnancy history fully reviewed. Patient does not intend to breast feed. Patient obstetrical history is significant for pre-eclampsia and HELLP syndrome with delivery at 28.5wk . She reports next delivery was spontaneous labor at 37.5 weeks. She reports both deliveries vaginal and otherwise uncomplicated.   Sexual Activity and Vaginal Concerns: Patient is not currently sexually active.  She denies vaginal discharge, bleeding, irritation, or odor. Patient denies pain with urination but reports some discomfort if she holds it too long.  However she acknowledges this occurred in all pregnancy.     Medical History/ROS: Patient denies medical history significant for cardiovascular, respiratory, gastrointestinal, or hematological disorders. Patient reports history of anxiety and was taking medication, but discontinued due to tachycardia. Patient also reports anxiety was at its worse during PP period. She denies history of MH disorders including anxiety and/or depression.  Patient reports heartburn and nausea,  but is managing her symptoms.   Patient denies constipation/diarrhea.  No new onset or recurrent headaches.    Social History: Patient denies history or current usage of tobacco, alcohol,  vapingor drugs.  Patient reports the FOB is Auriah Oakden who is supportive. She reports he is outside with younger kids.  Patient reports that she  lives with husband, kids, and pets (2 dogs and cat) and endorses safety at home.  Patient denies DV/A. Patient is not currently employed.  HISTORY: OB History  Gravida Para Term Preterm AB Living  4 2 1 1 1 2   SAB IAB Ectopic Multiple Live Births  1 0 0 1 2    # Outcome Date GA Lbr Len/2nd Weight Sex Type Anes PTL Lv  4 Current           3 Term 2023 [redacted]w[redacted]d   F Vag-Spont EPI Y LIV  2 Preterm 2020 [redacted]w[redacted]d   M Vag-Spont EPI N LIV  1A SAB 2019          1B SAB 2019            Last pap smear was done June 2022 (per patient) and was normal  Past Medical History:  Diagnosis Date   Asthma    "As a kid"   Miscarriage    No known health problems    Past Surgical History:  Procedure Laterality Date   DILATION AND EVACUATION N/A 11/23/2017   Procedure: DILATATION AND EVACUATION;  Surgeon: Conard Novak, MD;  Location: [facility] ORS;  Service: Gynecology;  Laterality: N/A;   LAPAROSCOPIC APPENDECTOMY N/A 11/13/2021   Procedure: APPENDECTOMY LAPAROSCOPIC;  Surgeon: Franky Macho, MD;  Location: [facility];  Service: General;  Laterality: N/A;   NO PAST SURGERIES     Family History  Problem Relation Age of Onset   Schizophrenia Father    Social History   Tobacco Use   Smoking status: Never   Smokeless tobacco: Never  Vaping Use   Vaping status: Never Used  Substance Use Topics  Alcohol use: Not Currently   Drug use: Never   Allergies  Allergen Reactions   Septra [Sulfamethoxazole-Trimethoprim]    Current Outpatient Medications on File Prior to Visit  Medication Sig Dispense Refill   aspirin 81 MG chewable tablet Chew by mouth daily.     doxylamine, Sleep, (UNISOM) 25 MG tablet Take 25 mg by mouth at bedtime as needed for sleep.     pyridoxine (B-6) 100 MG tablet Take 100 mg by mouth daily.     amoxicillin (AMOXIL) 500 MG capsule Take 2 capsules (1,000 mg total) by mouth 2 (two) times daily. (Patient not taking: Reported on 01/19/2023) 40 capsule 0   ibuprofen (ADVIL) 800 MG tablet  Take 1 tablet (800 mg total) by mouth 3 (three) times daily. Take with food (Patient not taking: Reported on 01/19/2023) 21 tablet 0   No current facility-administered medications on file prior to visit.    Review of Systems Pertinent items noted in HPI and remainder of comprehensive ROS otherwise negative.  Exam   Vitals:   01/26/23 0824  BP: 121/73  Pulse: (!) 104  Weight: 204 lb (92.5 kg)      Physical Exam Constitutional:      Appearance: Normal appearance.  Genitourinary:     Vulva normal.     Genitourinary Comments: NEFG GC/CT collected blindly BME reveals uterine size c/w 16 wk uterus. Non tender  HENT:     Head: Normocephalic and atraumatic.  Eyes:     Conjunctiva/sclera: Conjunctivae normal.  Cardiovascular:     Rate and Rhythm: Regular rhythm. Tachycardia present.  Chest:     Comments: Breast exam completed and normal. Mild tenderness noted bilaterally.  Abdominal:     General: Bowel sounds are normal.     Palpations: Abdomen is soft.     Tenderness: There is no abdominal tenderness.     Comments: Fundus at U/-4  Musculoskeletal:        General: Normal range of motion.     Cervical back: Normal range of motion.  Neurological:     Mental Status: She is alert and oriented to person, place, and time.  Skin:    General: Skin is warm and dry.  Psychiatric:        Mood and Affect: Mood normal.        Behavior: Behavior normal.  Vitals reviewed. Exam conducted with a chaperone present [place], [place]).     Assessment:   27 y.o. year old G4P1112 Patient Active Problem List   Diagnosis Date Noted   Obesity in pregnancy, antepartum 01/26/2023   Supervision of other high risk pregnancy, antepartum 01/19/2023   Appendicitis 11/13/2021   Appendicitis, acute 11/13/2021   Incomplete abortion 11/23/2017   Anemia due to acute blood loss 11/23/2017   Uterine hemorrhage 11/23/2017   Incomplete abortion with delayed or excessive hemorrhage 11/23/2017     Plan:   1. Supervision of other high risk pregnancy, antepartum -Congratulations given and patient welcomed to practice. -Discussed usage of Babyscripts and virtual visits as additional source of managing and completing PN visits.   *Instructed to take blood pressure and record weekly into babyscripts. *Reviewed prenatal visit schedule and platforms used for virtual visits.  -Anticipatory guidance for prenatal visits including labs, ultrasounds, and testing; Initial labs reviewed from 12/09/2022 appt at [facility]. -Genetic Screening discussed, First trimester screen, Quad screen, and NIPS: ordered. Patient to return tomorrow for lab draws.  -Encouraged to complete and utilize MyChart Registration for her ability to review results, send  requests, and have questions addressed.  -Discussed estimated due date of July 15, 2023. -Ultrasound discussed; fetal anatomic survey: ordered. -Continue prenatal vitamins  -Influenza offered and accepted. -Encouraged to seek out care at office or emergency room for urgent and/or emergent concerns. -Educated on the nature of [facility] with multiple MDs and other Advanced Practice Providers was explained to patient; also emphasized that residents, students are part of our team. Informed of her right to refuse care as she deems appropriate.  -No questions or concerns.    2. History of HELLP syndrome, currently pregnant -Taking bASA -CBC and CMP reviewed from 9/4 labs in [place]. -Baseline PC Ratio ordered  3. Obesity in pregnancy, antepartum -Taking bASA. -Instructed to continue. -Referral for Cardio-OB placed. -Discussed weight gain parameters of 11-20lbs.  4. History of anxiety disorder -GAD 11 today. -Discussed IBH referral patient agreeable. -Encouraged to report worsening of symptoms.   5. Elevated hemoglobin A1c -Plan for early one hour. -Patient to return tomorrow.  6. Pap smear of cervix declined -Patient reports pap in  June 2022. -Review of chart shows no records with most recent 03/2018. -Plan for 36 week or PP pap smear.     Problem list reviewed and updated. Routine obstetric precautions reviewed.  No orders of the defined types were placed in this encounter.   No follow-ups on file.     Cherre Robins, CNM 01/26/2023 8:41 AM

## 2023-01-27 ENCOUNTER — Ambulatory Visit (INDEPENDENT_AMBULATORY_CARE_PROVIDER_SITE_OTHER): Payer: Medicaid Other

## 2023-01-27 DIAGNOSIS — O09899 Supervision of other high risk pregnancies, unspecified trimester: Secondary | ICD-10-CM

## 2023-01-27 DIAGNOSIS — Z3A15 15 weeks gestation of pregnancy: Secondary | ICD-10-CM

## 2023-01-27 DIAGNOSIS — O09892 Supervision of other high risk pregnancies, second trimester: Secondary | ICD-10-CM

## 2023-01-27 DIAGNOSIS — O24419 Gestational diabetes mellitus in pregnancy, unspecified control: Secondary | ICD-10-CM

## 2023-01-27 LAB — GC/CHLAMYDIA PROBE AMP (~~LOC~~) NOT AT ARMC
Chlamydia: NEGATIVE
Comment: NEGATIVE
Comment: NORMAL
Neisseria Gonorrhea: NEGATIVE

## 2023-01-27 LAB — PROTEIN / CREATININE RATIO, URINE
Creatinine, Urine: 78.5 mg/dL
Protein, Ur: 8.8 mg/dL
Protein/Creat Ratio: 112 mg/g{creat} (ref 0–200)

## 2023-01-27 NOTE — Progress Notes (Signed)
Chart reviewed - agree with CMA/RN documentation.  ° °

## 2023-01-27 NOTE — Progress Notes (Signed)
Patient here for lab requisition slip and will report to the lab to have labs collected.

## 2023-01-27 NOTE — Addendum Note (Signed)
Addended by: Mikey Bussing on: 01/27/2023 09:57 AM   Modules accepted: Orders

## 2023-01-28 LAB — GLUCOSE TOLERANCE, 2 HOURS W/ 1HR
Glucose, 1 hour: 184 mg/dL — ABNORMAL HIGH (ref 70–179)
Glucose, 2 hour: 160 mg/dL — ABNORMAL HIGH (ref 70–152)
Glucose, Fasting: 83 mg/dL (ref 70–91)

## 2023-01-29 LAB — CULTURE, OB URINE

## 2023-01-29 LAB — URINE CULTURE, OB REFLEX

## 2023-01-29 MED ORDER — LANCETS MISC. MISC: 1.0000 | Freq: Four times a day (QID) | 3 refills | Status: AC

## 2023-01-29 MED ORDER — BLOOD GLUCOSE MONITORING SUPPL DEVI: 1.0000 | Freq: Four times a day (QID) | 0 refills | Status: DC

## 2023-01-29 MED ORDER — BLOOD GLUCOSE TEST VI STRP: 1.0000 | ORAL_STRIP | Freq: Three times a day (TID) | 0 refills | Status: DC

## 2023-01-29 MED ORDER — LANCET DEVICE MISC: 1.0000 | Freq: Three times a day (TID) | 0 refills | Status: AC

## 2023-01-29 NOTE — Addendum Note (Signed)
Addended by: Levie Heritage on: 01/29/2023 02:09 PM   Modules accepted: Orders

## 2023-02-02 LAB — PANORAMA PRENATAL TEST FULL PANEL:PANORAMA TEST PLUS 5 ADDITIONAL MICRODELETIONS: FETAL FRACTION: 2.4

## 2023-02-03 LAB — HORIZON CUSTOM: REPORT SUMMARY: NEGATIVE

## 2023-02-03 NOTE — BH Specialist Note (Signed)
Integrated Behavioral Health via Telemedicine Visit  02/17/2023 Wanda Erickson [id number]  Number of Integrated Behavioral Health Clinician visits: 1- Initial Visit  Session Start time: 1016   Session End time: 1102  Total time in minutes: 46   Referring Provider: Gerrit Heck, CNM Patient/Family location: Home [facility] Provider location: [facility]  All persons participating in visit: Patient Wanda Erickson and [facility] Wanda Erickson   Types of Service: Individual psychotherapy and Video visit  I connected with Wanda Erickson and/or Wanda Erickson's  n/a  via  Telephone or Video Enabled Telemedicine Application  (Video is Caregility application) and verified that I am speaking with the correct person using two identifiers. Discussed confidentiality: Yes   I discussed the limitations of telemedicine and the availability of in person appointments.  Discussed there is a possibility of technology failure and discussed alternative modes of communication if that failure occurs.  I discussed that engaging in this telemedicine visit, they consent to the provision of behavioral healthcare and the services will be billed under their insurance.  Patient and/or legal guardian expressed understanding and consented to Telemedicine visit: Yes   Presenting Concerns: Patient and/or family reports the following symptoms/concerns: Fatigue, anxiety, worry, irritability; pt open to implementing self-coping strategy to manage anxiety; goal is to prevent escalation of anxiety through postpartum time.  Duration of problem: Ongoing; Severity of problem: moderate  Patient and/or Family's Strengths/Protective Factors: Social connections, Concrete supports in place (healthy food, safe environments, etc.), Sense of purpose, and Physical Health (exercise, healthy diet, medication compliance, etc.)  Goals Addressed: Patient will:  Reduce symptoms of:  anxiety   Increase knowledge and/or ability of: healthy habits and self-management skills   Demonstrate ability to: Increase healthy adjustment to current life circumstances  Progress towards Goals: Ongoing  Interventions: Interventions utilized:  Mindfulness or Management consultant, Psychoeducation and/or Health Education, and Link to [employer] Standardized Assessments completed: Not Needed  Patient and/or Family Response: Patient agrees with treatment plan.   Assessment: Patient currently experiencing Generalized anxiety disorder.   Patient may benefit from psychoeducation and brief therapeutic interventions regarding coping with symptoms of anxiety .  Plan: Follow up with behavioral health clinician on : One month Behavioral recommendations:  -Continue taking prenatal vitamin as prescribed -CALM relaxation breathing exercise twice daily (morning; at bedtime with sleep sounds); as needed throughout the day. -Read through information on After Visit Summary; use as needed Referral(s): Integrated Art gallery manager (In Clinic) and Community Resources:  Postpartum planner  I discussed the assessment and treatment plan with the patient and/or parent/guardian. They were provided an opportunity to ask questions and all were answered. They agreed with the plan and demonstrated an understanding of the instructions.   They were advised to call back or seek an in-person evaluation if the symptoms worsen or if the condition fails to improve as anticipated.  Wanda Lips, LCSW     01/26/2023    8:50 AM  Depression screen PHQ 2/9  Decreased Interest 0  Down, Depressed, Hopeless 0  PHQ - 2 Score 0  Altered sleeping 2  Tired, decreased energy 3  Change in appetite 3  Feeling bad or failure about yourself  0  Trouble concentrating 0  Moving slowly or fidgety/restless 0  Suicidal thoughts 0  PHQ-9 Score 8      01/26/2023    8:50 AM  GAD 7 : Generalized  Anxiety Score  Nervous, Anxious, on Edge 2  Control/stop worrying 2  Worry too much - different things 2  Trouble relaxing 2  Restless 0  Easily annoyed or irritable 3  Afraid - awful might happen 0  Total GAD 7 Score 11

## 2023-02-15 DIAGNOSIS — O24419 Gestational diabetes mellitus in pregnancy, unspecified control: Secondary | ICD-10-CM | POA: Insufficient documentation

## 2023-02-17 ENCOUNTER — Ambulatory Visit (INDEPENDENT_AMBULATORY_CARE_PROVIDER_SITE_OTHER): Payer: Medicaid Other | Admitting: Clinical

## 2023-02-17 ENCOUNTER — Encounter: Payer: Self-pay | Admitting: *Deleted

## 2023-02-17 DIAGNOSIS — F411 Generalized anxiety disorder: Secondary | ICD-10-CM

## 2023-02-17 NOTE — Patient Instructions (Addendum)
[facility] [address] [place], [place] [address] [phone] (main office) [phone] (Pasha Broad's office)  [email or web address]     BRAINSTORMING  Develop a Plan Goals: Provide a way to start conversation about your new life with a baby Assist parents in recognizing and using resources within their reach Help pave the way before birth for an easier period of transition afterwards.  Make a list of the following information to keep in a central location: Full name of Mom and Partner: _____________________________________________ Baby's full name and Date of Birth: ___________________________________________ Home Address: ___________________________________________________________ ________________________________________________________________________ Home Phone: ____________________________________________________________ Parents' cell numbers: _____________________________________________________ ________________________________________________________________________ Name and contact info for OB: ______________________________________________ Name and contact info for Pediatrician:________________________________________ Contact info for Lactation Consultants: ________________________________________  REST and SLEEP *You each need at least 4-5 hours of uninterrupted sleep every day. Write specific names and contact information.* How are you going to rest in the postpartum period? While partner's home? When partner returns to work? When you both return to work? Where will your baby sleep? Who is available to help during the day? Evening? Night? Who could move in for a period to help support you? What are some ideas to help you get enough  sleep? __________________________________________________________________________________________________________________________________________________________________________________________________________________________________________ NUTRITIOUS FOOD AND DRINK *Plan for meals before your baby is born so you can have healthy food to eat during the immediate postpartum period.* Who will look after breakfast? Lunch? Dinner? List names and contact information. Brainstorm quick, healthy ideas for each meal. What can you do before baby is born to prepare meals for the postpartum period? How can others help you with meals? Which grocery stores provide online shopping and delivery? Which restaurants offer take-out or delivery options? ______________________________________________________________________________________________________________________________________________________________________________________________________________________________________________________________________________________________________________________________________________________________________________________________________  CARE FOR MOM *It's important that mom is cared for and pampered in the postpartum period. Remember, the most important ways new mothers need care are: sleep, nutrition, gentle exercise, and time off.* Who can come take care of mom during this period? Make a list of people with their contact information. List some activities that make you feel cared for, rested, and energized? Who can make sure you have opportunities to do these things? Does mom have a space of her very own within your home that's just for her? Make a "Mama Cave" where she can be comfortable, rest, and renew herself  daily. ______________________________________________________________________________________________________________________________________________________________________________________________________________________________________________________________________________________________________________________________________________________________________________________________________    CARE FOR AND FEEDING BABY *Knowledgeable and encouraging people will offer the best support with regard to feeding your baby.* Educate yourself and choose the best feeding option for your baby. Make a list of people who will guide, support, and be a resource for you as your care for and feed your baby. (Friends that have breastfed or are currently breastfeeding, lactation consultants, breastfeeding support groups, etc.) Consider a postpartum doula. (These websites can give you information: dona.org & padanc.org) Seek out local breastfeeding resources like the breastfeeding support group at [facility] or [employer]. ______________________________________________________________________________________________________________________________________________________________________________________________________________________________________________________________________________________________________________________________________________________________________________________________________  CHORES AND ERRANDS Who can help with a thorough cleaning before baby is born? Make a list of people who will help with housekeeping and chores, like laundry, light cleaning, dishes, bathrooms, etc. Who can run some errands for you? What can you do to make sure you are stocked with basic supplies before baby is born? Who is going to do the  shopping? ______________________________________________________________________________________________________________________________________________________________________________________________________________________________________________________________________________________________________________________________________________________________________________________________________     Family Adjustment *Nurture yourselves.it helps parents be more loving and allows for better bonding with their child.* What sorts of things do you and   partner enjoy doing together? Which activities help you to connect and strengthen your relationship? Make a list of those things. Make a list of people whom you trust to care for your baby so you can have some time together as a couple. What types of things help partner feel connected to Mom? Make a list. What needs will partner have in order to bond with baby? Other children? Who will care for them when you go into labor and while you are in the hospital? Think about what the needs of your older children might be. Who can help you meet those needs? In what ways are you helping them prepare for bringing baby home? List some specific strategies you have for family adjustment. _______________________________________________________________________________________________________________________________________________________________________________________________________________________________________________________________________________________________________________________________________________  SUPPORT *Someone who can empathize with experiences normalizes your problems and makes them more bearable.* Make a list of other friends, neighbors, and/or co-workers you know with infants (and small children, if applicable) with whom you can connect. Make a list of local or online support groups, mom groups, etc. in which you can be  involved. ______________________________________________________________________________________________________________________________________________________________________________________________________________________________________________________________________________________________________________________________________________________________________________________________________  Childcare Plans Investigate and plan for childcare if mom is returning to work. Talk about mom's concerns about her transition back to work. Talk about partner's concerns regarding this transition.  Mental Health *Your mental health is one of the highest priorities for a pregnant or postpartum mom.* 1 in 5 women experience anxiety and/or depression from the time of conception through the first year after birth. Postpartum Mood Disorders are the #1 complication of pregnancy and childbirth and the suffering experienced by these mothers is not necessary! These illnesses are temporary and respond well to treatment, which often includes self-care, social support, talk therapy, and medication when needed. Women experiencing anxiety and depression often say things like: "I'm supposed to be happy.why do I feel so sad?", "Why can't I snap out of it?", "I'm having thoughts that scare me." There is no need to be embarrassed if you are feeling these symptoms: Overwhelmed, anxious, angry, sad, guilty, irritable, hopeless, exhausted but can't sleep You are NOT alone. You are NOT to blame. With help, you WILL be well. Where can I find help? Medical professionals such as your OB, midwife, gynecologist, family practitioner, primary care provider, pediatrician, or mental health providers; [facility] support groups: Feelings After Birth, Breastfeeding Support Group, Baby and Me Group, and Fit 4 Two exercise classes. You have permission to ask for help. It will confirm your feelings, validate your experiences,  share/learn coping strategies, and gain support and encouragement as you heal. You are important! BRAINSTORM Make a list of local resources, including resources for mom and for partner. Identify support groups. Identify people to call late at night - include names and contact info. Talk with partner about perinatal mood and anxiety disorders. Talk with your OB, midwife, and doula about baby blues and about perinatal mood and anxiety disorders. Talk with your pediatrician about perinatal mood and anxiety disorders.   Support & Sanity Savers   What do you really need?  Basics In preparing for a new baby, many expectant parents spend hours shopping for baby clothes, decorating the nursery, and deciding which car seat to buy. Yet most don't think much about what the reality of parenting a newborn will be like, and what they need to make it through that. So, here is the advice of experienced parents. We know you'll read this, and think "they're exaggerating, I don't really need that." Just trust us on these, OK? Plan for   all of this, and if it turns out you don't need it, come back and teach us how you did it!  Must-Haves (Once baby's survival needs are met, make sure you attend to your own survival needs!) Sleep An average newborn sleeps 16-18 hours per day, over 6-7 sleep periods, rarely more than three hours at a time. It is normal and healthy for a newborn to wake throughout the night... but really hard on parents!! Naps. Prioritize sleep above any responsibilities like: cleaning house, visiting friends, running errands, etc.  Sleep whenever baby sleeps. If you can't nap, at least have restful times when baby eats. The more rest you get, the more patient you will be, the more emotionally stable, and better at solving problems.  Food You may not have realized it would be difficult to eat when you have a newborn. Yet, when we talk to countless new parents, they say things like "it may be 2:00 pm  when I realize I haven't had breakfast yet." Or "every time we sit down to dinner, baby needs to eat, and my food gets cold, so I don't bother to eat it." Finger food. Before your baby is born, stock up with one months' worth of food that: 1) you can eat with one hand while holding a baby, 2) doesn't need to be prepped, 3) is good hot or cold, 4) doesn't spoil when left out for a few hours, and 5) you like to eat. Think about: nuts, dried fruit, Clif bars, pretzels, jerky, gogurt, baby carrots, apples, bananas, crackers, cheez-n-crackers, string cheese, hot pockets or frozen burritos to microwave, garden burgers and breakfast pastries to put in the toaster, yogurt drinks, etc. Restaurant Menus. Make lists of your favorite restaurants & menu items. When family/friends want to help, you can give specific information without much thought. They can either bring you the food or send gift cards for just the right meals. Freezer Meals.  Take some time to make a few meals to put in the freezer ahead of time.  Easy to freeze meals can be anything such as soup, lasagna, chicken pie, or spaghetti sauce. Set up a Meal Schedule.  Ask friends and family to sign up to bring you meals during the first few weeks of being home. (It can be passed around at baby showers!) You have no idea how helpful this will be until you are in the throes of parenting.  www.takethemameal.com is a great website to check out. Emotional Support Know who to call when you're stressed out. Parenting a newborn is very challenging work. There are times when it totally overwhelms your normal coping abilities. EVERY NEW PARENT NEEDS TO HAVE A PLAN FOR WHO TO CALL WHEN THEY JUST CAN'T COPE ANY MORE. (And it has to be someone other than the baby's other parent!) Before your baby is born, come up with at least one person you can call for support - write their phone number down and post it on the refrigerator. Anxiety & Sadness. Baby blues are normal after  pregnancy; however, there are more severe types of anxiety & sadness which can occur and should not be ignored.  They are always treatable, but you have to take the first step by reaching out for help. [facility] offers a "Mom Talk" group which meets every Tuesday from 10 am - 11 am.  This group is for new moms who need support and connection after their babies are born.  Call [phone].  Really, Really Helpful (Plan   for them! Make sure these happen often!!) Physical Support with Taking Care of Yourselves Asking friends and family. Before your baby is born, set up a schedule of people who can come and visit and help out (or ask a friend to schedule for you). Any time someone says "let me know what I can do to help," sign them up for a day. When they get there, their job is not to take care of the baby (that's your job and your joy). Their job is to take care of you!  Postpartum doulas. If you don't have anyone you can call on for support, look into postpartum doulas:  professionals at helping parents with caring for baby, caring for themselves, getting breastfeeding started, and helping with household tasks. www.padanc.org is a helpful website for learning about doulas in our area. Peer Support / Parent Groups Why: One of the greatest ideas for new parents is to be around other new parents. Parent groups give you a chance to share and listen to others who are going through the same season of life, get a sense of what is normal infant development by watching several babies learn and grow, share your stories of triumph and struggles with empathetic ears, and forgive your own mistakes when you realize all parents are learning by trial and error. Where to find: There are many places you can meet other new parents throughout our community.  [facility] offers the following classes for new moms and their little ones:  Baby and Me (Birth to Crawling) and Breastfeeding Support Group. Go to  [email or web address] or call [phone] for more information. Time for your Relationship It's easy to get so caught up in meeting baby's immediate needs that it's hard to find time to connect with your partner, and meet the needs of your relationship. It's also easy to forget what "quality time with your partner" actually looks like. If you take your baby on a date, you'd be amazed how much of your couple time is spent feeding the baby, diapering the baby, admiring the baby, and talking about the baby. Dating: Try to take time for just the two of you. Babysitter tip: Sometimes when moms are breastfeeding a newborn, they find it hard to figure out how to schedule outings around baby's unpredictable feeding schedules. Have the babysitter come for a three hour period. When she comes over, if baby has just eaten, you can leave right away, and come back in two hours. If baby hasn't fed recently, you start the date at home. Once baby gets hungry and gets a good feeding in, you can head out for the rest of your date time. Date Nights at Home: If you can't get out, at least set aside one evening a week to prioritize your relationship: whenever baby dozes off or doesn't have any immediate needs, spend a little time focusing on each other. Potential conflicts: The main relationship conflicts that come up for new parents are: issues related to sexuality, financial stresses, a feeling of an unfair division of household tasks, and conflicts in parenting styles. The more you can work on these issues before baby arrives, the better!  Fun and Frills (Don't forget these. and don't feel guilty for indulging in them!) Everyone has something in life that is a fun little treat that they do just for themselves. It may be: reading the morning paper, or going for a daily jog, or having coffee with a friend once a week, or going to a movie on   Friday nights, or fine chocolates, or bubble baths, or curling up with a good  book. Unless you do fun things for yourself every now and then, it's hard to have the energy for fun with your baby. Whatever your "special" treats are, make sure you find a way to continue to indulge in them after your baby is born. These special moments can recharge you, and allow you to return to baby with a new joy   PERINATAL MOOD DISORDERS: MATERNAL MENTAL HEALTH FROM CONCEPTION THROUGH THE POSTPARTUM PERIOD   _________________________________________Emergency and Crisis Resources If you are an imminent risk to self or others, are experiencing intense personal distress, and/or have noticed significant changes in activities of daily living, call:  911 [facility]: [phone]  [address], [place], [place], [address] Mobile Crisis: 877-626-1772 National Suicide Hotline: 988 Or visit the following crisis centers: Local Emergency Departments [facility]: [address], [place]  [phone]. Hours: 8:30AM-5PM. Insurance Accepted: Medicaid, Medicare, and Uninsured.  [facility]:  [address], [place]  Mon-Friday 8am-3pm, [phone]                                                                                  ___________ Non-Crisis Resources To identify specific providers that are covered by your insurance, contact your insurance company or local agencies:  [facility]: 1-800-256-2452 [facility]: 888-581-9988 [facility]: 1-800-939-5911 Postpartum Support International- Warm-line: 1-800-944-4773                                                      __Outpatient Therapy and Medication Management   Providers:  [facility]: [phone] Hours: 9AM-5PM  Insurance Accepted: AARP, Aetna, BCBS, Cigna, [employer], Humana, Medicare  [facility] [phone] Hours: 8AM-5:30PM  nsurance Accepted: All insurances EXCEPT AARP, Aetna,  [employer], and Humana [facility]: [phone] Hours: 8AM-8PM Insurance Accepted: Aetna, BCBS, Cigna, [employer], Medicaid, Medicare, Uninsured [facility]: [phone] [facility]: [phone] Hours: 8:30AM-7PM Insurance Accepted: Aetna, BCBS, Medicaid, Medicare, Tricare, United Healthcare [facility]:  [phone]   Hours:9AM-5PM Insurance Accepted:  Aetna, BCBS, [facility], Medicaid, United Health Care  [facility]: [phone] Hours: 9AM-5:30PM Insurance Accepted: AARP, Aetna, BCBS, Cigna, and Medicaid, Medicare, United Health Care [facility]:  [phone] Hours: 9am-5pm Insurance Accepted: BCBS; they do not accept Medicaid/Medicare [facility]: [phone] Hours: 9am-9pm Insurance Accepted: All major insurance including Medicaid and Medicare [facility]: [phone] Hours: 9AM- 5PM Insurance Accepted: All insurances EXCEPT Medicaid and Medicare. [facility]: [phone]   ____________                                                                       Parenting Support Groups [facility]: [phone] [facility]:  [phone] Family Support Network: (support for children in the NICU and/or with special needs), [phone]   ___________                                                                 Mental Health Support Groups Mental Health Association: [phone]    _____________                                                                                  Online Resources Postpartum Support International: http://www.postpartum.net/  800-944-4PPD 2Moms Supporting Moms:  www.momssupportingmoms.net   /Emotional Wellbeing Apps and Websites Here are a few free apps meant to help you to help yourself.  To find, try searching on the internet to see if the app is offered on Apple/Android devices. If  your first choice doesn't come up on your device, the good news is that there are many choices! Play around with different apps to see which ones are helpful to you.    Calm This is an app meant to help increase calm feelings. Includes info, strategies, and tools for tracking your feelings.      Calm Harm  This app is meant to help with self-harm. Provides many 5-minute or 15-min coping strategies for doing instead of hurting yourself.       Healthy Minds Health Minds is a problem-solving tool to help deal with emotions and cope with stress you encounter wherever you are.      MindShift This app can help people cope with anxiety. Rather than trying to avoid anxiety, you can make an important shift and face it.      MY3  MY3 features a support system, safety plan and resources with the goal of offering a tool to use in a time of need.       My Life My Voice  This mood journal offers a simple solution for tracking your thoughts, feelings and moods. Animated emoticons can help identify your mood.       Relax Melodies Designed to help with sleep, on this app you can mix sounds and meditations for relaxation.      Smiling Mind Smiling Mind is meditation made easy: it's a simple tool that helps put a smile on your mind.        Stop, Breathe & Think  A friendly, simple guide for people through meditations for mindfulness and compassion.  Stop, Breathe and Think Kids Enter your current feelings and choose a "mission" to help you cope. Offers videos for certain moods instead of just sound recordings.       Team Orange The goal of this tool is to help teens change how they think, act, and react. This app helps you focus on your own good feelings and experiences.      The Virtual Hope Box The Virtual Hope Box (VHB) contains simple tools to help patients   with coping, relaxation, distraction, and positive thinking.     

## 2023-02-22 ENCOUNTER — Encounter: Payer: Medicaid Other | Admitting: Obstetrics & Gynecology

## 2023-02-23 ENCOUNTER — Ambulatory Visit (INDEPENDENT_AMBULATORY_CARE_PROVIDER_SITE_OTHER): Payer: Medicaid Other

## 2023-02-23 VITALS — BP 134/73 | HR 121 | Wt 205.0 lb

## 2023-02-23 DIAGNOSIS — O09899 Supervision of other high risk pregnancies, unspecified trimester: Secondary | ICD-10-CM

## 2023-02-23 DIAGNOSIS — Z3A19 19 weeks gestation of pregnancy: Secondary | ICD-10-CM

## 2023-02-23 DIAGNOSIS — O24419 Gestational diabetes mellitus in pregnancy, unspecified control: Secondary | ICD-10-CM

## 2023-02-23 NOTE — Progress Notes (Addendum)
Pt presents for ROB  Gestational Diabetes Reports FBS 85-94 Postprandial BS 120-130  Declines AFP  Desires flu vaccine today  Anatomy US on 11/22

## 2023-02-23 NOTE — Progress Notes (Signed)
   HIGH-RISK PREGNANCY OFFICE VISIT  Patient name: Wanda Erickson MRN [id number]  Date of birth: 06/25/1995 Chief Complaint:   Routine Prenatal Visit  Subjective:   Wanda Erickson is a 27 y.o. [id number] female at [redacted]w[redacted]d with an Estimated Date of Delivery: 07/15/23 being seen today for ongoing management of a high-risk pregnancy aeb has Uterine hemorrhage; Supervision of other high risk pregnancy, antepartum; Obesity in pregnancy, antepartum; History of anxiety disorder; History of HELLP syndrome, currently pregnant; Elevated hemoglobin A1c; and GDM (gestational diabetes mellitus) on their problem list.  Patient presents today, with her son and daughter, with fatigue.  She states that it may not all be pregnancy related.  She does report one incident of SOB while en route today.   Patient endorses fetal movement. Patient denies abdominal cramping or contractions, but reports ome occasional tightness.  Patient denies vaginal concerns including abnormal discharge, leaking of fluid, and bleeding. No issues with urination, constipation, or diarrhea.    Contractions: Not present. Vag. Bleeding: None.  Movement: Present.  Reviewed past medical,surgical, social, obstetrical and family history as well as problem list, medications and allergies.  Objective   Vitals:   02/23/23 1511  BP: 134/73  Pulse: (!) 121  Weight: 205 lb (93 kg)  Body mass index is 36.31 kg/m.  Total Weight Gain:6 lb (2.722 kg)         Physical Examination:   General appearance: Well appearing, and in no distress  Mental status: Alert, oriented to person, place, and time  Skin: Warm & dry  Cardiovascular: Normal heart rate noted  Respiratory: Normal respiratory effort, no distress  Abdomen: Appears Gravid  Pelvic: Cervical exam deferred           Extremities: Edema: None  Fetal Status:    Movement: Present   No results found for this or any previous visit (from the past 24 hour(s)).  Assessment & Plan:   High-risk pregnancy of a 27 y.o., G4W1027 at [redacted]w[redacted]d with an Estimated Date of Delivery: 07/15/23   1. Supervision of other high risk pregnancy, antepartum -Anticipatory guidance for upcoming appts. -Patient to schedule next appt in 4 weeks for an in-person visit.   2. [redacted] weeks gestation of pregnancy  -Declines AFP  3. Gestational diabetes mellitus (GDM) in second trimester, gestational diabetes method of control unspecified -CBGs overall: Dates: 02/13/2023-02/18/2023 *Fasting: 87-91 *Breakfast: 121-159 *Lunch: 113-129 *Dinner: 129 -Reports she doesn't always eat dinner, because she is not hungry. -Given information on GDM self care. -Scheduled for  Diabetic Education -States she has not taken values in a few days to pharmacy not having lancets or strips until 11/22.         Meds: No orders of the defined types were placed in this encounter.  Labs/procedures today:  Lab Orders  No laboratory test(s) ordered today     Reviewed: Preterm labor symptoms and general obstetric precautions including but not limited to vaginal bleeding, contractions, leaking of fluid and fetal movement were reviewed in detail with the patient.  All questions were answered.  Follow-up: Return in about 4 weeks (around 03/23/2023).  No orders of the defined types were placed in this encounter.  Cherre Robins MSN, CNM 02/23/2023

## 2023-02-24 ENCOUNTER — Encounter: Payer: Self-pay | Admitting: Dietician

## 2023-02-24 ENCOUNTER — Encounter: Payer: Medicaid Other | Attending: Family Medicine | Admitting: Dietician

## 2023-02-24 VITALS — Wt 206.4 lb

## 2023-02-24 DIAGNOSIS — Z3A00 Weeks of gestation of pregnancy not specified: Secondary | ICD-10-CM | POA: Diagnosis not present

## 2023-02-24 DIAGNOSIS — O24419 Gestational diabetes mellitus in pregnancy, unspecified control: Secondary | ICD-10-CM | POA: Insufficient documentation

## 2023-02-24 NOTE — Progress Notes (Signed)
Patient was seen on 02/24/2023 for Gestational Diabetes self-management class at the [facility]. The following learning objectives were met by the patient during this course:  States the definition of Gestational Diabetes States why dietary management is important in controlling blood glucose Describes the effects each nutrient has on blood glucose levels Demonstrates ability to create a balanced meal plan Demonstrates carbohydrate counting  States when to check blood glucose levels Demonstrates proper blood glucose monitoring techniques States the effect of stress and exercise on blood glucose levels States the importance of limiting caffeine and abstaining from alcohol and smoking  Patient instructed to monitor glucose levels: FBS: 60 - <90 1 hour: <140 2 hour: <120  *Patient received handouts: Nutrition Diabetes and Pregnancy Blood glucose log Snack ideas for diabetes during pregnancy  Patient will be seen for follow-up as needed.

## 2023-02-25 ENCOUNTER — Other Ambulatory Visit: Payer: Self-pay | Admitting: Family Medicine

## 2023-02-25 DIAGNOSIS — O24419 Gestational diabetes mellitus in pregnancy, unspecified control: Secondary | ICD-10-CM

## 2023-02-26 ENCOUNTER — Encounter: Payer: Self-pay | Admitting: *Deleted

## 2023-02-26 ENCOUNTER — Ambulatory Visit: Payer: Medicaid Other | Attending: Family Medicine

## 2023-02-26 ENCOUNTER — Other Ambulatory Visit: Payer: Self-pay

## 2023-02-26 ENCOUNTER — Other Ambulatory Visit: Payer: Self-pay | Admitting: *Deleted

## 2023-02-26 ENCOUNTER — Ambulatory Visit: Payer: Medicaid Other | Admitting: *Deleted

## 2023-02-26 VITALS — BP 145/79 | HR 102

## 2023-02-26 DIAGNOSIS — O24419 Gestational diabetes mellitus in pregnancy, unspecified control: Secondary | ICD-10-CM | POA: Diagnosis present

## 2023-02-26 DIAGNOSIS — O99212 Obesity complicating pregnancy, second trimester: Secondary | ICD-10-CM

## 2023-02-26 DIAGNOSIS — Z3A20 20 weeks gestation of pregnancy: Secondary | ICD-10-CM | POA: Diagnosis not present

## 2023-02-26 DIAGNOSIS — E669 Obesity, unspecified: Secondary | ICD-10-CM

## 2023-02-26 DIAGNOSIS — O0990 Supervision of high risk pregnancy, unspecified, unspecified trimester: Secondary | ICD-10-CM

## 2023-02-26 DIAGNOSIS — O24410 Gestational diabetes mellitus in pregnancy, diet controlled: Secondary | ICD-10-CM

## 2023-02-26 DIAGNOSIS — Z8759 Personal history of other complications of pregnancy, childbirth and the puerperium: Secondary | ICD-10-CM | POA: Diagnosis present

## 2023-02-26 DIAGNOSIS — Z6835 Body mass index (BMI) 35.0-35.9, adult: Secondary | ICD-10-CM | POA: Insufficient documentation

## 2023-02-26 DIAGNOSIS — O09899 Supervision of other high risk pregnancies, unspecified trimester: Secondary | ICD-10-CM | POA: Insufficient documentation

## 2023-02-26 DIAGNOSIS — O09299 Supervision of pregnancy with other poor reproductive or obstetric history, unspecified trimester: Secondary | ICD-10-CM

## 2023-03-08 NOTE — BH Specialist Note (Unsigned)
Integrated Behavioral Health via Telemedicine Visit  03/08/2023 Wanda Erickson [id number]  Number of Integrated Behavioral Health Clinician visits: 1- Initial Visit  Session Start time: 1016   Session End time: 1102  Total time in minutes: 46   Referring Provider: Gerrit Heck, CNM Patient/Family location: Home*** [facility] Provider location: [facility]  All persons participating in visit: Patient Wanda Erickson and [facility] Wanda Erickson ***  Types of Service: {CHL AMB TYPE OF SERVICE:[phone]}  I connected with Wanda Erickson and/or Wanda Erickson's {family members:20773} via  Telephone or Video Enabled Telemedicine Application  (Video is Caregility application) and verified that I am speaking with the correct person using two identifiers. Discussed confidentiality: Yes   I discussed the limitations of telemedicine and the availability of in person appointments.  Discussed there is a possibility of technology failure and discussed alternative modes of communication if that failure occurs.  I discussed that engaging in this telemedicine visit, they consent to the provision of behavioral healthcare and the services will be billed under their insurance.  Patient and/or legal guardian expressed understanding and consented to Telemedicine visit: Yes   Presenting Concerns: Patient and/or family reports the following symptoms/concerns: *** Duration of problem: ***; Severity of problem: {Mild/Moderate/Severe:20260}  Patient and/or Family's Strengths/Protective Factors: {CHL AMB BH PROTECTIVE FACTORS:[phone]}  Goals Addressed: Patient will:  Reduce symptoms of: {IBH Symptoms:21014056}   Increase knowledge and/or ability of: {IBH Patient Tools:21014057}   Demonstrate ability to: {IBH Goals:21014053}  Progress towards Goals: {CHL AMB BH PROGRESS TOWARDS GOALS:[phone]}  Interventions: Interventions utilized:  {IBH  Interventions:21014054} Standardized Assessments completed: {IBH Screening Tools:21014051}  Patient and/or Family Response: Patient agrees with treatment plan. ***  Assessment: Patient currently experiencing ***.   Patient may benefit from psychoeducation and brief therapeutic interventions regarding coping with symptoms of *** .  Plan: Follow up with behavioral health clinician on : *** Behavioral recommendations:  -*** -*** Referral(s): {IBH Referrals:21014055}  I discussed the assessment and treatment plan with the patient and/or parent/guardian. They were provided an opportunity to ask questions and all were answered. They agreed with the plan and demonstrated an understanding of the instructions.   They were advised to call back or seek an in-person evaluation if the symptoms worsen or if the condition fails to improve as anticipated.  Rae Lips, LCSW     02/24/2023    5:08 PM 01/26/2023    8:50 AM  Depression screen PHQ 2/9  Decreased Interest 0 0  Down, Depressed, Hopeless 0 0  PHQ - 2 Score 0 0  Altered sleeping  2  Tired, decreased energy  3  Change in appetite  3  Feeling bad or failure about yourself   0  Trouble concentrating  0  Moving slowly or fidgety/restless  0  Suicidal thoughts  0  PHQ-9 Score  8      01/26/2023    8:50 AM  GAD 7 : Generalized Anxiety Score  Nervous, Anxious, on Edge 2  Control/stop worrying 2  Worry too much - different things 2  Trouble relaxing 2  Restless 0  Easily annoyed or irritable 3  Afraid - awful might happen 0  Total GAD 7 Score 11

## 2023-03-18 ENCOUNTER — Encounter: Payer: Medicaid Other | Admitting: Family Medicine

## 2023-03-22 ENCOUNTER — Ambulatory Visit: Payer: Medicaid Other | Admitting: Clinical

## 2023-03-22 DIAGNOSIS — F411 Generalized anxiety disorder: Secondary | ICD-10-CM | POA: Diagnosis not present

## 2023-03-22 NOTE — Patient Instructions (Signed)
[facility] [address] [place], [place] [address] [phone] (main office) [phone] (Eknoor Novack's office)     BRAINSTORMING  Develop a Plan Goals: Provide a way to start conversation about your new life with a baby Assist parents in recognizing and using resources within their reach Help pave the way before birth for an easier period of transition afterwards.  Make a list of the following information to keep in a central location: Full name of Mom and Partner: _____________________________________________ Baby's full name and Date of Birth: ___________________________________________ Home Address: ___________________________________________________________ ________________________________________________________________________ Home Phone: ____________________________________________________________ Parents' cell numbers: _____________________________________________________ ________________________________________________________________________ Name and contact info for OB: ______________________________________________ Name and contact info for Pediatrician:________________________________________ Contact info for Lactation Consultants: ________________________________________  REST and SLEEP *You each need at least 4-5 hours of uninterrupted sleep every day. Write specific names and contact information.* How are you going to rest in the postpartum period? While partner's home? When partner returns to work? When you both return to work? Where will your baby sleep? Who is available to help during the day? Evening? Night? Who could move in for a period to help support you? What are some ideas to help you get enough  sleep? __________________________________________________________________________________________________________________________________________________________________________________________________________________________________________ NUTRITIOUS FOOD AND DRINK *Plan for meals before your baby is born so you can have healthy food to eat during the immediate postpartum period.* Who will look after breakfast? Lunch? Dinner? List names and contact information. Brainstorm quick, healthy ideas for each meal. What can you do before baby is born to prepare meals for the postpartum period? How can others help you with meals? Which grocery stores provide online shopping and delivery? Which restaurants offer take-out or delivery options? ______________________________________________________________________________________________________________________________________________________________________________________________________________________________________________________________________________________________________________________________________________________________________________________________________  CARE FOR MOM *It's important that mom is cared for and pampered in the postpartum period. Remember, the most important ways new mothers need care are: sleep, nutrition, gentle exercise, and time off.* Who can come take care of mom during this period? Make a list of people with their contact information. List some activities that make you feel cared for, rested, and energized? Who can make sure you have opportunities to do these things? Does mom have a space of her very own within your home that's just for her? Make a "Mama Cave" where she can be comfortable, rest, and renew herself  daily. ______________________________________________________________________________________________________________________________________________________________________________________________________________________________________________________________________________________________________________________________________________________________________________________________________    CARE FOR AND FEEDING BABY *Knowledgeable and encouraging people will offer the [place] support with regard to feeding your baby.* Educate yourself and choose the [place] feeding option for your baby. Make a list of people who will guide, support, and be a resource for you as your care for and feed your baby. (Friends that have breastfed or are currently breastfeeding, lactation consultants, breastfeeding support groups, etc.) Consider a postpartum doula. (These websites can give you information: dona.org & padanc.org) Seek out local breastfeeding resources like the breastfeeding support group at [facility] or [employer]ec[employer]. ______________________________________________________________________________________________________________________________________________________________________________________________________________________________________________________________________________________________________________________________________________________________________________________________________  CHORES AND ERRANDS Who can help with a thorough cleaning before baby is born? Make a list of people who will help with housekeeping and chores, like laundry, light cleaning, dishes, bathrooms, etc. Who can run some errands for you? What can you do to make sure you are stocked with basic supplies before baby is born? Who is going to do the  shopping? ______________________________________________________________________________________________________________________________________________________________________________________________________________________________________________________________________________________________________________________________________________________________________________________________________     Family Adjustment *Nurture yourselves.it helps parents be more loving and allows for better bonding with their child.* What sorts of things do you and partner enjoy   doing together? Which activities help you to connect and strengthen your relationship? Make a list of those things. Make a list of people whom you trust to care for your baby so you can have some time together as a couple. What types of things help partner feel connected to Mom? Make a list. What needs will partner have in order to bond with baby? Other children? Who will care for them when you go into labor and while you are in the hospital? Think about what the needs of your older children might be. Who can help you meet those needs? In what ways are you helping them prepare for bringing baby home? List some specific strategies you have for family adjustment. _______________________________________________________________________________________________________________________________________________________________________________________________________________________________________________________________________________________________________________________________________________  SUPPORT *Someone who can empathize with experiences normalizes your problems and makes them more bearable.* Make a list of other friends, neighbors, and/or co-workers you know with infants (and small children, if applicable) with whom you can connect. Make a list of local or online support groups, mom groups, etc. in which you can be  involved. ______________________________________________________________________________________________________________________________________________________________________________________________________________________________________________________________________________________________________________________________________________________________________________________________________  Childcare Plans Investigate and plan for childcare if mom is returning to work. Talk about mom's concerns about her transition back to work. Talk about partner's concerns regarding this transition.  Mental Health *Your mental health is one of the highest priorities for a pregnant or postpartum mom.* 1 in 5 women experience anxiety and/or depression from the time of conception through the first year after birth. Postpartum Mood Disorders are the #1 complication of pregnancy and childbirth and the suffering experienced by these mothers is not necessary! These illnesses are temporary and respond well to treatment, which often includes self-care, social support, talk therapy, and medication when needed. Women experiencing anxiety and depression often say things like: "I'm supposed to be happy.why do I feel so sad?", "Why can't I snap out of it?", "I'm having thoughts that scare me." There is no need to be embarrassed if you are feeling these symptoms: Overwhelmed, anxious, angry, sad, guilty, irritable, hopeless, exhausted but can't sleep You are NOT alone. You are NOT to blame. With help, you WILL be well. Where can I find help? Medical professionals such as your OB, midwife, gynecologist, family practitioner, primary care provider, pediatrician, or mental health providers; [facility] support groups: Feelings After Birth, Breastfeeding Support Group, Baby and Me Group, and Fit 4 Two exercise classes. You have permission to ask for help. It will confirm your feelings, validate your experiences,  share/learn coping strategies, and gain support and encouragement as you heal. You are important! BRAINSTORM Make a list of local resources, including resources for mom and for partner. Identify support groups. Identify people to call late at night - include names and contact info. Talk with partner about perinatal mood and anxiety disorders. Talk with your OB, midwife, and doula about baby blues and about perinatal mood and anxiety disorders. Talk with your pediatrician about perinatal mood and anxiety disorders.   Support & Sanity Savers   What do you really need?  Basics In preparing for a new baby, many expectant parents spend hours shopping for baby clothes, decorating the nursery, and deciding which car seat to buy. Yet most don't think much about what the reality of parenting a newborn will be like, and what they need to make it through that. So, here is the advice of experienced parents. We know you'll read this, and think "they're exaggerating, I don't really need that." Just trust us on these, OK? Plan for all of   this, and if it turns out you don't need it, come back and teach us how you did it!  Must-Haves (Once baby's survival needs are met, make sure you attend to your own survival needs!) Sleep An average newborn sleeps 16-18 hours per day, over 6-7 sleep periods, rarely more than three hours at a time. It is normal and healthy for a newborn to wake throughout the night... but really hard on parents!! Naps. Prioritize sleep above any responsibilities like: cleaning house, visiting friends, running errands, etc.  Sleep whenever baby sleeps. If you can't nap, at least have restful times when baby eats. The more rest you get, the more patient you will be, the more emotionally stable, and better at solving problems.  Food You may not have realized it would be difficult to eat when you have a newborn. Yet, when we talk to countless new parents, they say things like "it may be 2:00 pm  when I realize I haven't had breakfast yet." Or "every time we sit down to dinner, baby needs to eat, and my food gets cold, so I don't bother to eat it." Finger food. Before your baby is born, stock up with one months' worth of food that: 1) you can eat with one hand while holding a baby, 2) doesn't need to be prepped, 3) is good hot or cold, 4) doesn't spoil when left out for a few hours, and 5) you like to eat. Think about: nuts, dried fruit, Clif bars, pretzels, jerky, gogurt, baby carrots, apples, bananas, crackers, cheez-n-crackers, string cheese, hot pockets or frozen burritos to microwave, garden burgers and breakfast pastries to put in the toaster, yogurt drinks, etc. Restaurant Menus. Make lists of your favorite restaurants & menu items. When family/friends want to help, you can give specific information without much thought. They can either bring you the food or send gift cards for just the right meals. Freezer Meals.  Take some time to make a few meals to put in the freezer ahead of time.  Easy to freeze meals can be anything such as soup, lasagna, chicken pie, or spaghetti sauce. Set up a Meal Schedule.  Ask friends and family to sign up to bring you meals during the first few weeks of being home. (It can be passed around at baby showers!) You have no idea how helpful this will be until you are in the throes of parenting.  www.takethemameal.com is a great website to check out. Emotional Support Know who to call when you're stressed out. Parenting a newborn is very challenging work. There are times when it totally overwhelms your normal coping abilities. EVERY NEW PARENT NEEDS TO HAVE A PLAN FOR WHO TO CALL WHEN THEY JUST CAN'T COPE ANY MORE. (And it has to be someone other than the baby's other parent!) Before your baby is born, come up with at least one person you can call for support - write their phone number down and post it on the refrigerator. Anxiety & Sadness. Baby blues are normal after  pregnancy; however, there are more severe types of anxiety & sadness which can occur and should not be ignored.  They are always treatable, but you have to take the first step by reaching out for help. [facility] offers a "Mom Talk" group which meets every Tuesday from 10 am - 11 am.  This group is for new moms who need support and connection after their babies are born.  Call [phone].  Really, Really Helpful (Plan for them!   Make sure these happen often!!) Physical Support with Taking Care of Yourselves Asking friends and family. Before your baby is born, set up a schedule of people who can come and visit and help out (or ask a friend to schedule for you). Any time someone says "let me know what I can do to help," sign them up for a day. When they get there, their job is not to take care of the baby (that's your job and your joy). Their job is to take care of you!  Postpartum doulas. If you don't have anyone you can call on for support, look into postpartum doulas:  professionals at helping parents with caring for baby, caring for themselves, getting breastfeeding started, and helping with household tasks. www.padanc.org is a helpful website for learning about doulas in our area. Peer Support / Parent Groups Why: One of the greatest ideas for new parents is to be around other new parents. Parent groups give you a chance to share and listen to others who are going through the same season of life, get a sense of what is normal infant development by watching several babies learn and grow, share your stories of triumph and struggles with empathetic ears, and forgive your own mistakes when you realize all parents are learning by trial and error. Where to find: There are many places you can meet other new parents throughout our community.  [facility] offers the following classes for new moms and their little ones:  Baby and Me (Birth to Crawling) and Breastfeeding Support Group. Go to  www.conehealthybaby.com or call [phone] for more information. Time for your Relationship It's easy to get so caught up in meeting baby's immediate needs that it's hard to find time to connect with your partner, and meet the needs of your relationship. It's also easy to forget what "quality time with your partner" actually looks like. If you take your baby on a date, you'd be amazed how much of your couple time is spent feeding the baby, diapering the baby, admiring the baby, and talking about the baby. Dating: Try to take time for just the two of you. Babysitter tip: Sometimes when moms are breastfeeding a newborn, they find it hard to figure out how to schedule outings around baby's unpredictable feeding schedules. Have the babysitter come for a three hour period. When she comes over, if baby has just eaten, you can leave right away, and come back in two hours. If baby hasn't fed recently, you start the date at home. Once baby gets hungry and gets a good feeding in, you can head out for the rest of your date time. Date Nights at Home: If you can't get out, at least set aside one evening a week to prioritize your relationship: whenever baby dozes off or doesn't have any immediate needs, spend a little time focusing on each other. Potential conflicts: The main relationship conflicts that come up for new parents are: issues related to sexuality, financial stresses, a feeling of an unfair division of household tasks, and conflicts in parenting styles. The more you can work on these issues before baby arrives, the better!  Fun and Frills (Don't forget these. and don't feel guilty for indulging in them!) Everyone has something in life that is a fun little treat that they do just for themselves. It may be: reading the morning paper, or going for a daily jog, or having coffee with a friend once a week, or going to a movie on Friday nights,   or fine chocolates, or bubble baths, or curling up with a good  book. Unless you do fun things for yourself every now and then, it's hard to have the energy for fun with your baby. Whatever your "special" treats are, make sure you find a way to continue to indulge in them after your baby is born. These special moments can recharge you, and allow you to return to baby with a new joy   PERINATAL MOOD DISORDERS: MATERNAL MENTAL HEALTH FROM CONCEPTION THROUGH THE POSTPARTUM PERIOD   _________________________________________Emergency and Crisis Resources If you are an imminent risk to self or others, are experiencing intense personal distress, and/or have noticed significant changes in activities of daily living, call:  911 [facility]: [phone]  [address], [place], [place], [address] Mobile Crisis: 877-626-1772 National Suicide Hotline: 988 Or visit the following crisis centers: Local Emergency Departments [facility]: [address], [place]  [phone]. Hours: 8:30AM-5PM. Insurance Accepted: Medicaid, Medicare, and Uninsured.  [facility]:  [address], [place]  Mon-Friday 8am-3pm, [phone]                                                                                  ___________ Non-Crisis Resources To identify specific providers that are covered by your insurance, contact your insurance company or local agencies:  [facility]: 1-800-256-2452 [facility]: 888-581-9988 [facility]: 1-800-939-5911 Postpartum Support International- Warm-line: 1-800-944-4773                                                      __Outpatient Therapy and Medication Management   Providers:  [facility]: [phone] Hours: 9AM-5PM  Insurance Accepted: AARP, Aetna, BCBS, Cigna, [employer], Humana, Medicare  [facility] [phone] Hours: 8AM-5:30PM  nsurance Accepted: All insurances EXCEPT AARP, Aetna,  [employer], and Humana [facility]: [phone] Hours: 8AM-8PM Insurance Accepted: Aetna, BCBS, Cigna, [employer], Medicaid, Medicare, Uninsured [facility]: [phone] [facility]: [phone] Hours: 8:30AM-7PM Insurance Accepted: Aetna, BCBS, Medicaid, Medicare, Tricare, United Healthcare [facility]:  [phone]   Hours:9AM-5PM Insurance Accepted:  Aetna, BCBS, [facility], Medicaid, United Health Care  [facility]: [phone] Hours: 9AM-5:30PM Insurance Accepted: AARP, Aetna, BCBS, Cigna, and Medicaid, Medicare, United Health Care [facility]:  [phone] Hours: 9am-5pm Insurance Accepted: BCBS; they do not accept Medicaid/Medicare [facility]: [phone] Hours: 9am-9pm Insurance Accepted: All major insurance including Medicaid and Medicare [facility]: [phone] Hours: 9AM- 5PM Insurance Accepted: All insurances EXCEPT Medicaid and Medicare. [facility]: [phone]   ____________                                                                       Parenting Support Groups [facility]: [phone] [facility]:  [phone] Family Support Network: (support for children in the NICU and/or with special needs), [phone]   ___________                                                                 Mental Health Support Groups Mental Health Association: [phone]    _____________                                                                                  Online Resources Postpartum Support International: http://www.postpartum.net/  800-944-4PPD 2Moms Supporting Moms:  www.momssupportingmoms.net    

## 2023-04-05 ENCOUNTER — Ambulatory Visit: Payer: Medicaid Other | Admitting: *Deleted

## 2023-04-05 ENCOUNTER — Ambulatory Visit: Payer: Medicaid Other | Attending: Obstetrics

## 2023-04-05 ENCOUNTER — Encounter: Payer: Self-pay | Admitting: *Deleted

## 2023-04-05 ENCOUNTER — Other Ambulatory Visit: Payer: Self-pay | Admitting: *Deleted

## 2023-04-05 VITALS — BP 125/75 | HR 111

## 2023-04-05 DIAGNOSIS — O09899 Supervision of other high risk pregnancies, unspecified trimester: Secondary | ICD-10-CM | POA: Diagnosis present

## 2023-04-05 DIAGNOSIS — O24419 Gestational diabetes mellitus in pregnancy, unspecified control: Secondary | ICD-10-CM | POA: Diagnosis present

## 2023-04-05 DIAGNOSIS — Z3A25 25 weeks gestation of pregnancy: Secondary | ICD-10-CM

## 2023-04-05 DIAGNOSIS — O3662X0 Maternal care for excessive fetal growth, second trimester, not applicable or unspecified: Secondary | ICD-10-CM | POA: Diagnosis not present

## 2023-04-05 DIAGNOSIS — O24410 Gestational diabetes mellitus in pregnancy, diet controlled: Secondary | ICD-10-CM | POA: Diagnosis not present

## 2023-04-05 DIAGNOSIS — O09299 Supervision of pregnancy with other poor reproductive or obstetric history, unspecified trimester: Secondary | ICD-10-CM | POA: Diagnosis present

## 2023-04-05 DIAGNOSIS — O09292 Supervision of pregnancy with other poor reproductive or obstetric history, second trimester: Secondary | ICD-10-CM

## 2023-04-05 DIAGNOSIS — O09293 Supervision of pregnancy with other poor reproductive or obstetric history, third trimester: Secondary | ICD-10-CM

## 2023-04-05 DIAGNOSIS — O0990 Supervision of high risk pregnancy, unspecified, unspecified trimester: Secondary | ICD-10-CM | POA: Insufficient documentation

## 2023-04-05 DIAGNOSIS — O99212 Obesity complicating pregnancy, second trimester: Secondary | ICD-10-CM

## 2023-04-05 DIAGNOSIS — E669 Obesity, unspecified: Secondary | ICD-10-CM

## 2023-04-05 DIAGNOSIS — O99210 Obesity complicating pregnancy, unspecified trimester: Secondary | ICD-10-CM

## 2023-04-07 NOTE — L&D Delivery Note (Addendum)
 OB/GYN Faculty Practice Delivery Note  Wanda Erickson is a 28 y.o. N5A2130 s/p SVD at [redacted]w[redacted]d. She was admitted for IOL for gHTN.   ROM: 2h 54m with clear fluid GBS Status:  Negative/-- (03/11 1044) Maximum Maternal Temperature: 98.2  Labor Progress: Initial SVE: 5/50/-3 Patient received epidural Pitocin started at 11:12 AROMc at 17:30 with SVE still 5/50/-3 Patient then progressed to complete  Delivery Date/Time:  Delivery: Called to room. Patient progressed to complete and started pushing. Head delivered at 19:45. No nuchal cord present although cord wrapped around ankle. Shoulder and body delivered in usual fashion. Infant with spontaneous cry, placed on mother's abdomen, dried and stimulated. Cord clamped x2 after 1-minute delay and cut by FOB. Cord blood drawn. Placenta delivered spontaneously with gentle cord traction. Noted to have trailing membrane and with large blood clot. Fundus firm with massage and pitocin. Labia, perineum, vagina, and cervix inspected inspected. First degree vaginal laceration noted to be hemostatic. First degree periclitoral laceration hemostatic following repair with interrupted sutures x3.  Baby Weight: pending  Placenta: Sent to pathology Complications: None Lacerations: First degree vaginal, first degree periclitoral EBL: 76 mL Analgesia: Epidural   Infant:  APGAR (1 MIN): 9  APGAR (5 MINS): 9    Wanda Erickson is a 27y G4 now P2113 s/p SVD at [redacted]w[redacted]d after admission for IOL for gHTN.  --PPD0: Routine PP care. EBL 76mL, Hgb 9.3 on admit. --gHTN: No home meds. Asymptomatic, BP labs WNL on admit. Intermittent MRBPs intrapartum. Continue to monitor. Plan for PP BP check after discharge. --gDMA1: Diet-controlled. Plan for 2hr GTT at [facility] visit. 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 Anticipate discharge on PPD1-2.   Thereasa Solo, MD Center for  [facility] 06/28/2023, 8:21 PM

## 2023-04-12 ENCOUNTER — Telehealth: Payer: Self-pay

## 2023-04-12 NOTE — Telephone Encounter (Signed)
 Patient left voicemail about needing assistance getting her mychart set up. Returned call and left voicemail for her to call us back.

## 2023-04-20 ENCOUNTER — Ambulatory Visit (INDEPENDENT_AMBULATORY_CARE_PROVIDER_SITE_OTHER): Payer: Medicaid Other

## 2023-04-20 VITALS — BP 110/80 | HR 115 | Wt 208.0 lb

## 2023-04-20 DIAGNOSIS — Z23 Encounter for immunization: Secondary | ICD-10-CM | POA: Diagnosis not present

## 2023-04-20 DIAGNOSIS — Z3A27 27 weeks gestation of pregnancy: Secondary | ICD-10-CM

## 2023-04-20 DIAGNOSIS — O24410 Gestational diabetes mellitus in pregnancy, diet controlled: Secondary | ICD-10-CM

## 2023-04-20 DIAGNOSIS — O99013 Anemia complicating pregnancy, third trimester: Secondary | ICD-10-CM

## 2023-04-20 DIAGNOSIS — O09899 Supervision of other high risk pregnancies, unspecified trimester: Secondary | ICD-10-CM

## 2023-04-20 DIAGNOSIS — O09299 Supervision of pregnancy with other poor reproductive or obstetric history, unspecified trimester: Secondary | ICD-10-CM

## 2023-04-20 DIAGNOSIS — O09292 Supervision of pregnancy with other poor reproductive or obstetric history, second trimester: Secondary | ICD-10-CM

## 2023-04-20 NOTE — Progress Notes (Signed)
 HIGH-RISK PREGNANCY OFFICE VISIT  Patient name: Wanda Erickson MRN [id number]  Date of birth: 05-21-1995 Chief Complaint:   Routine Prenatal Visit  Subjective:   Wanda Erickson is a 28 y.o. [id number] female at [redacted]w[redacted]d with an Estimated Date of Delivery: 07/15/23 being seen today for ongoing management of a high-risk pregnancy aeb has Uterine hemorrhage; Supervision of other high risk pregnancy, antepartum; Obesity in pregnancy, antepartum; History of anxiety disorder; History of HELLP syndrome, currently pregnant; Elevated hemoglobin A1c; and GDM (gestational diabetes mellitus) on their problem list.  Patient presents today, alone, with  no complaints .  However, she reports increased stress d/t death of dog, but feels she is coping well. Patient endorses fetal movement. Patient reports abdominal cramping last night and contributes to over activity, but denies contractions.  Patient denies vaginal concerns including abnormal discharge, leaking of fluid, and bleeding. No issues with urination, constipation, or diarrhea. Reports sugars have been good, but unable to bring log.  States highest was 153.   Contractions: Not present. Vag. Bleeding: None.  Movement: Present.  Reviewed past medical,surgical, social, obstetrical and family history as well as problem list, medications and allergies.  Objective   Vitals:   04/20/23 0903  BP: 110/80  Pulse: (!) 115  Weight: 208 lb (94.3 kg)  Body mass index is 36.85 kg/m.  Total Weight Gain:9 lb (4.082 kg)         Physical Examination:   General appearance: Well appearing, and in no distress  Mental status: Alert, oriented to person, place, and time  Skin: Warm & dry  Cardiovascular: Normal heart rate noted  Respiratory: Normal respiratory effort, no distress  Abdomen: Soft, gravid, nontender, LGA with Fundal Height: 34 cm  Pelvic: Cervical exam deferred           Extremities: Edema: Trace  Fetal Status: Fetal Heart Rate (bpm): 140   Movement: Present   No results found for this or any previous visit (from the past 24 hours).  Assessment & Plan:  High-risk pregnancy of a 28 y.o., [id number] at [redacted]w[redacted]d with an Estimated Date of Delivery: 07/15/23   1. Supervision of other high risk pregnancy, antepartum -Anticipatory guidance for upcoming appts. -Patient to schedule next appt in 2 weeks for an in-person visit.  2. [redacted] weeks gestation of pregnancy -Doing well. -Complete 3rd trimester labs today.   3. Diet controlled gestational diabetes mellitus (GDM), antepartum -Reports log not available -US  on 12/30 showing LGA infant at 95th%ile. Next US  on 2/3. Per MFM, plan for IOL at 38 weeks if LGA remains persistent.  -Discussed importance of tracking and bringing in logs for review.    4. History of HELLP syndrome, currently pregnant -Taking bASA. -HA a few days ago, but relieved without medication.      Meds: No orders of the defined types were placed in this encounter.  Labs/procedures today:  Lab Orders         CBC         HIV Antibody (routine testing w rflx)         RPR       Reviewed: Preterm labor symptoms and general obstetric precautions including but not limited to vaginal bleeding, contractions, leaking of fluid and fetal movement were reviewed in detail with the patient.  All questions were answered.  Follow-up: Return in about 2 weeks (around 05/04/2023).  Orders Placed This Encounter  Procedures   Tdap vaccine greater than or equal to 7yo IM   CBC   HIV  Antibody (routine testing w rflx)   RPR   Harlene LITTIE Duncans MSN, CNM 04/20/2023

## 2023-04-21 LAB — CBC
Hematocrit: 32.1 % — ABNORMAL LOW (ref 34.0–46.6)
Hemoglobin: 9.9 g/dL — ABNORMAL LOW (ref 11.1–15.9)
MCH: 24.8 pg — ABNORMAL LOW (ref 26.6–33.0)
MCHC: 30.8 g/dL — ABNORMAL LOW (ref 31.5–35.7)
MCV: 80 fL (ref 79–97)
Platelets: 346 10*3/uL (ref 150–450)
RBC: 4 x10E6/uL (ref 3.77–5.28)
RDW: 14.4 % (ref 11.7–15.4)
WBC: 11.4 10*3/uL — ABNORMAL HIGH (ref 3.4–10.8)

## 2023-04-21 LAB — RPR: RPR Ser Ql: NONREACTIVE

## 2023-04-21 LAB — HIV ANTIBODY (ROUTINE TESTING W REFLEX): HIV Screen 4th Generation wRfx: NONREACTIVE

## 2023-04-23 DIAGNOSIS — O99013 Anemia complicating pregnancy, third trimester: Secondary | ICD-10-CM | POA: Insufficient documentation

## 2023-04-23 MED ORDER — FERROUS SULFATE 325 (65 FE) MG PO TBEC: 325.0000 mg | DELAYED_RELEASE_TABLET | ORAL | 1 refills | Status: DC

## 2023-04-23 NOTE — Addendum Note (Signed)
Addended by: Gerrit Heck L on: 04/23/2023 02:22 AM   Modules accepted: Orders

## 2023-04-29 DIAGNOSIS — O139 Gestational [pregnancy-induced] hypertension without significant proteinuria, unspecified trimester: Secondary | ICD-10-CM | POA: Diagnosis present

## 2023-04-30 DIAGNOSIS — R000 Tachycardia, unspecified: Secondary | ICD-10-CM | POA: Insufficient documentation

## 2023-05-04 ENCOUNTER — Ambulatory Visit (INDEPENDENT_AMBULATORY_CARE_PROVIDER_SITE_OTHER): Payer: Medicaid Other

## 2023-05-04 VITALS — BP 116/82 | HR 121 | Wt 209.0 lb

## 2023-05-04 DIAGNOSIS — O09299 Supervision of pregnancy with other poor reproductive or obstetric history, unspecified trimester: Secondary | ICD-10-CM

## 2023-05-04 DIAGNOSIS — Z3A29 29 weeks gestation of pregnancy: Secondary | ICD-10-CM

## 2023-05-04 DIAGNOSIS — O24410 Gestational diabetes mellitus in pregnancy, diet controlled: Secondary | ICD-10-CM

## 2023-05-04 DIAGNOSIS — O09899 Supervision of other high risk pregnancies, unspecified trimester: Secondary | ICD-10-CM

## 2023-05-04 NOTE — Progress Notes (Signed)
HIGH-RISK PREGNANCY OFFICE VISIT  Patient name: Wanda Erickson MRN [id number]  Date of birth: Dec 11, 1995 Chief Complaint:   Routine Prenatal Visit (Patient seen in l&D last Thursday for contraction at [facility].  Sent home with heart monitor in place x 2 weeks for tachycardia.  )  Subjective:   Wanda Erickson is a 29 y.o. [phone] female at [redacted]w[redacted]d with an Estimated Date of Delivery: 07/15/23 being seen today for ongoing management of a high-risk pregnancy aeb has Uterine hemorrhage; Supervision of other high risk pregnancy, antepartum; Obesity in pregnancy, antepartum; History of anxiety disorder; History of HELLP syndrome, currently pregnant; Elevated hemoglobin A1c; GDM (gestational diabetes mellitus); and Anemia during pregnancy in third trimester on their problem list.  Patient presents today, alone, with  concerns regarding recent hospital visit and diagnosis of GHTN . She states she was seen at [facility] for PTL and elevated home bp of 172/154.  Per patient, she was observed for 12 hours and received CT scan and EKG which was normal.  Patient states providers were concerned with tachycardia and she was given holter monitor and instructed to return for follow up.  Patient states her tachycardia is of little concern as this was issue prior to pregnancy and likely r/t her anxiety.  However, patient was told to have weekly testing for gHTN and wants to know the plan.   Patient endorses fetal movement. Patient denies abdominal cramping or contractions.  Patient denies vaginal concerns including abnormal discharge, leaking of fluid, and bleeding. No issues with urination, constipation, or diarrhea.    Contractions: Not present. Vag. Bleeding: None.  Movement: Present.  Reviewed past medical,surgical, social, obstetrical and family history as well as problem list, medications and allergies.  Objective   Vitals:   05/04/23 0936  BP: 116/82  Pulse: (!) 121  Weight: 209 lb (94.8 kg)   Body mass index is 37.02 kg/m.  Total Weight Gain:10 lb (4.536 kg)         Physical Examination:   General appearance: Well appearing, and in no distress  Mental status: Alert, oriented to person, place, and time  Skin: Warm & dry  Cardiovascular: Normal heart rate noted  Respiratory: Normal respiratory effort, no distress  Abdomen: Soft, gravid, nontender, AGA with Fundal Height: 31 cm  Pelvic: Cervical exam deferred           Extremities: Edema: None  Fetal Status: Fetal Heart Rate (bpm): 150  Movement: Present   No results found for this or any previous visit (from the past 24 hours).  Assessment & Plan:  High-risk pregnancy of a 28 y.o., [id number] at [redacted]w[redacted]d with an Estimated Date of Delivery: 07/15/23   1. Supervision of other high risk pregnancy, antepartum -Anticipatory guidance for upcoming appts. -Patient to schedule next appt in 2 weeks for an in-person visit with MD.   2. [redacted] weeks gestation of pregnancy -Reviewed concerns and recent visit.  -Patient lives in [place] and extensive discussion regarding delivery hospital and where to go for emergency pregnancy care. -Encouraged to NOT drive past [facility] (20 min) to go to [facility] ( ) as the timing could be the difference between good vs bad outcomes in emergent situations.  -Patient agreeable and notes that she has had previous positive experiences with [facility] and finds comfort in them. -Support given.  Patient encouraged to allow [facility] and [place] to provide comfort and care as her current providers as our goal is to do what is best for her and baby if she  allows [place] to. -Patient expresses gratitude and reassurance.   3. Diet controlled gestational diabetes mellitus (GDM) in third trimester -CBGs with values only from 1/25-1/27.  -Ranges:  1/25: 16-109 1/26: 88-120 1/27: 91-138 1/28: Fasting 96 -Encouraged improved monitoring.   4. History of HELLP syndrome, currently pregnant -Reviewed previous visit in Care Everywhere  at [facility]. -Noted blood pressures of 122/63 and 131/84. -Labs supporting known anemia, but otherwise no significant findings for PreE: CBC: 108>10.8/32.4<295, PC Ratio 189mg /g, UA with 40+ ketones, +Leuks, and Squams, BUN: 8, Creatinine: .61, AST/ALT 12/11 -Patient informed that no supporting labs for PreE and blood pressures for gHTN diagnosis (outside of home value which seems obscured) is not noted. -However, would like to have patient meet with MFM (already scheduled) and MD to discuss POC and appropriate management.  -Patient instructed to follow up with cardiologist as scheduled.  -Patient agreeable and without further questions.     Meds: No orders of the defined types were placed in this encounter.  Labs/procedures today:  Lab Orders  No laboratory test(s) ordered today     Reviewed: Preterm labor symptoms and general obstetric precautions including but not limited to vaginal bleeding, contractions, leaking of fluid and fetal movement were reviewed in detail with the patient.  All questions were answered.  Follow-up: No follow-ups on file.  No orders of the defined types were placed in this encounter.  Cherre Robins MSN, CNM 05/04/2023

## 2023-05-10 ENCOUNTER — Ambulatory Visit: Payer: Medicaid Other | Attending: Maternal & Fetal Medicine

## 2023-05-10 ENCOUNTER — Ambulatory Visit: Payer: Medicaid Other

## 2023-05-16 ENCOUNTER — Encounter (HOSPITAL_COMMUNITY): Payer: Self-pay | Admitting: Obstetrics and Gynecology

## 2023-05-16 ENCOUNTER — Inpatient Hospital Stay (HOSPITAL_COMMUNITY)
Admission: AD | Admit: 2023-05-16 | Discharge: 2023-05-17 | Disposition: A | Discharge status: Home / Self Care | Payer: Medicaid Other | Attending: Obstetrics and Gynecology | Admitting: Obstetrics and Gynecology

## 2023-05-16 DIAGNOSIS — Z3A31 31 weeks gestation of pregnancy: Secondary | ICD-10-CM | POA: Diagnosis not present

## 2023-05-16 DIAGNOSIS — O09899 Supervision of other high risk pregnancies, unspecified trimester: Secondary | ICD-10-CM

## 2023-05-16 DIAGNOSIS — E860 Dehydration: Secondary | ICD-10-CM | POA: Insufficient documentation

## 2023-05-16 DIAGNOSIS — O4703 False labor before 37 completed weeks of gestation, third trimester: Secondary | ICD-10-CM | POA: Insufficient documentation

## 2023-05-16 DIAGNOSIS — O99283 Endocrine, nutritional and metabolic diseases complicating pregnancy, third trimester: Secondary | ICD-10-CM | POA: Diagnosis not present

## 2023-05-16 DIAGNOSIS — R030 Elevated blood-pressure reading, without diagnosis of hypertension: Secondary | ICD-10-CM

## 2023-05-16 DIAGNOSIS — O4700 False labor before 37 completed weeks of gestation, unspecified trimester: Secondary | ICD-10-CM

## 2023-05-16 DIAGNOSIS — O26893 Other specified pregnancy related conditions, third trimester: Secondary | ICD-10-CM | POA: Insufficient documentation

## 2023-05-16 DIAGNOSIS — O99280 Endocrine, nutritional and metabolic diseases complicating pregnancy, unspecified trimester: Secondary | ICD-10-CM

## 2023-05-16 HISTORY — DX: Type 2 diabetes mellitus without complications: E11.9

## 2023-05-16 LAB — CBC
HCT: 30.7 % — ABNORMAL LOW (ref 36.0–46.0)
Hemoglobin: 9.5 g/dL — ABNORMAL LOW (ref 12.0–15.0)
MCH: 24.3 pg — ABNORMAL LOW (ref 26.0–34.0)
MCHC: 30.9 g/dL (ref 30.0–36.0)
MCV: 78.5 fL — ABNORMAL LOW (ref 80.0–100.0)
Platelets: 359 10*3/uL (ref 150–400)
RBC: 3.91 MIL/uL (ref 3.87–5.11)
RDW: 15.6 % — ABNORMAL HIGH (ref 11.5–15.5)
WBC: 12.6 10*3/uL — ABNORMAL HIGH (ref 4.0–10.5)
nRBC: 0 % (ref 0.0–0.2)

## 2023-05-16 LAB — COMPREHENSIVE METABOLIC PANEL
ALT: 13 U/L (ref 0–44)
AST: 15 U/L (ref 15–41)
Albumin: 2.3 g/dL — ABNORMAL LOW (ref 3.5–5.0)
Alkaline Phosphatase: 74 U/L (ref 38–126)
Anion gap: 9 (ref 5–15)
BUN: 6 mg/dL (ref 6–20)
CO2: 20 mmol/L — ABNORMAL LOW (ref 22–32)
Calcium: 8.7 mg/dL — ABNORMAL LOW (ref 8.9–10.3)
Chloride: 109 mmol/L (ref 98–111)
Creatinine, Ser: 0.65 mg/dL (ref 0.44–1.00)
GFR, Estimated: >60 mL/min (ref >60)
Glucose, Bld: 96 mg/dL (ref 70–99)
Potassium: 4.1 mmol/L (ref 3.5–5.1)
Sodium: 138 mmol/L (ref 135–145)
Total Bilirubin: 0.3 mg/dL (ref 0.0–1.2)
Total Protein: 6.1 g/dL — ABNORMAL LOW (ref 6.5–8.1)

## 2023-05-16 LAB — URINALYSIS, ROUTINE W REFLEX MICROSCOPIC
Bacteria, UA: NONE SEEN
Bilirubin Urine: NEGATIVE
Glucose, UA: NEGATIVE mg/dL
Hgb urine dipstick: NEGATIVE
Ketones, ur: NEGATIVE mg/dL
Leukocytes,Ua: NEGATIVE
Nitrite: NEGATIVE
Protein, ur: NEGATIVE mg/dL
Specific Gravity, Urine: 1.008 (ref 1.005–1.030)
pH: 6 (ref 5.0–8.0)

## 2023-05-16 LAB — WET PREP, GENITAL
Clue Cells Wet Prep HPF POC: NONE SEEN
Sperm: NONE SEEN
Trich, Wet Prep: NONE SEEN
WBC, Wet Prep HPF POC: <10 (ref <10)
Yeast Wet Prep HPF POC: NONE SEEN

## 2023-05-16 LAB — PROTEIN / CREATININE RATIO, URINE
Creatinine, Urine: 59 mg/dL
Protein Creatinine Ratio: 0.12 mg/mg{creat} (ref 0.00–0.15)
Total Protein, Urine: 7 mg/dL

## 2023-05-16 MED ORDER — LACTATED RINGERS IV BOLUS
1000.0000 mL | Freq: Once | INTRAVENOUS | Status: AC
  Administered 2023-05-16: 1000 mL via INTRAVENOUS

## 2023-05-16 NOTE — MAU Note (Signed)
.  Wanda Erickson is a 28 y.o. at [redacted]w[redacted]d here in MAU reporting: about an hour ago ctx started about q5 min. She drank 3 big glasses of water and they did not go away. Good fetal movement felt. Denies any vag bleeding or leaking.   LMP:  Onset of complaint: 1 hour Pain score: 5 Vitals:   05/16/23 2047  BP: 139/87  Pulse: (!) 135  Resp: 18  Temp: 97.8 F (36.6 C)     FHT: 147  Lab orders placed from triage: u/a

## 2023-05-17 LAB — GC/CHLAMYDIA PROBE AMP (~~LOC~~) NOT AT ARMC
Chlamydia: NEGATIVE
Comment: NEGATIVE
Comment: NORMAL
Neisseria Gonorrhea: NEGATIVE

## 2023-05-17 NOTE — MAU Provider Note (Signed)
 History     CSN: [id number]  Arrival date and time: 05/16/23 2034   Event Date/Time   First Provider Initiated Contact with Patient 05/16/2023  8:46 PM   Chief Complaint  Patient presents with   Contractions    HPI  Wanda Erickson is a 28 y.o. [id number] at [redacted]w[redacted]d who presents to the MAU for contractions every 5 minutes. She states contractions began around 1615 and have gotten more intense since onset. She thought she may be dehydrated so she tried drinking three glasses of water. When contractions did not stop, she presented to MAU. States she typically drinks 1.5 to 2 40 oz tumblers of water a day. Reports being sick recently, but no current fevers, chills, nausea, vomiting, c/d, urinary pain. No VB, LOF. Having normal FM.  Past Medical History:  Diagnosis Date   Anemia due to acute blood loss 11/23/2017   Appendicitis 11/13/2021   Appendicitis, acute 11/13/2021   Asthma    "As a kid"   Diabetes mellitus without complication (HCC)    Incomplete abortion 11/23/2017   Incomplete abortion with delayed or excessive hemorrhage 11/23/2017   Miscarriage    No known health problems     Past Surgical History:  Procedure Laterality Date   DILATION AND EVACUATION N/A 11/23/2017   Procedure: DILATATION AND EVACUATION;  Surgeon: Kris Pester, MD;  Location: [facility] ORS;  Service: Gynecology;  Laterality: N/A;   LAPAROSCOPIC APPENDECTOMY N/A 11/13/2021   Procedure: APPENDECTOMY LAPAROSCOPIC;  Surgeon: Alanda Allegra, MD;  Location: [facility];  Service: General;  Laterality: N/A;   NO PAST SURGERIES      Family History  Problem Relation Age of Onset   Schizophrenia Father     Social History   Tobacco Use   Smoking status: Never   Smokeless tobacco: Never  Vaping Use   Vaping status: Never Used  Substance Use Topics   Alcohol use: Not Currently   Drug use: Never    Allergies:  Allergies  Allergen Reactions   Septra [Sulfamethoxazole-Trimethoprim]     No medications prior  to admission.    ROS reviewed and pertinent positives and negatives as documented in HPI.  Physical Exam   Blood pressure 130/81, pulse (!) 101, temperature 97.8 F (36.6 C), resp. rate 16, height 5\' 3"  (1.6 m), weight 93.9 kg, last menstrual period 10/08/2022, SpO2 95%.  Physical Exam Exam conducted with a chaperone present.  Constitutional:      General: She is not in acute distress.    Appearance: Normal appearance. She is not ill-appearing.  HENT:     Head: Normocephalic and atraumatic.  Cardiovascular:     Rate and Rhythm: Normal rate.  Pulmonary:     Effort: Pulmonary effort is normal.     Breath sounds: Normal breath sounds.  Abdominal:     Palpations: Abdomen is soft.     Tenderness: There is no abdominal tenderness. There is no guarding.  Genitourinary:    Comments: Cervix closed/long/high, speculum exam reveals pink and rugated, thin white vaginal discharge, no bleeding noted Musculoskeletal:        General: Normal range of motion.  Skin:    General: Skin is warm and dry.     Findings: No rash.  Neurological:     General: No focal deficit present.     Mental Status: She is alert and oriented to person, place, and time.    EFM: 140/mod/+a/-d MAU Course  Procedures  MDM 27 y.o. [id number] at [redacted]w[redacted]d presenting for preterm  contractions in setting of recent illness. On arrival here, pt had one elevated BP that was mild range. PEC w/up was negative. Speculum exam unremarkable, cx closed/long/high. She had a cat I strip throughout time in MAU. She was given a bolus of IV fluids with complete resolution of ctx.  Suspect dehydration driving UI and preterm contractions. Discussed importance of hydration with patient.   Assessment and Plan  Preterm uterine contractions  Dehydration during pregnancy In the setting of dehydration in pregnancy Resolved w IV fluids Rec increasing hydration Return precautions addressed  Elevated BP without diagnosis of hypertension Isolated  mild range BP, normotensive on repeat PEC w/up negative F/up w OB   Melanie Spires, MD OB Fellow, Faculty Practice [facility]  05/17/2023, 12:34 AM

## 2023-05-20 ENCOUNTER — Ambulatory Visit (INDEPENDENT_AMBULATORY_CARE_PROVIDER_SITE_OTHER): Payer: Medicaid Other | Admitting: Family Medicine

## 2023-05-20 VITALS — BP 122/82 | HR 98 | Wt 206.0 lb

## 2023-05-20 DIAGNOSIS — O24419 Gestational diabetes mellitus in pregnancy, unspecified control: Secondary | ICD-10-CM

## 2023-05-20 DIAGNOSIS — O99013 Anemia complicating pregnancy, third trimester: Secondary | ICD-10-CM

## 2023-05-20 DIAGNOSIS — O09899 Supervision of other high risk pregnancies, unspecified trimester: Secondary | ICD-10-CM | POA: Diagnosis not present

## 2023-05-20 DIAGNOSIS — O09299 Supervision of pregnancy with other poor reproductive or obstetric history, unspecified trimester: Secondary | ICD-10-CM

## 2023-05-20 DIAGNOSIS — Z6835 Body mass index (BMI) 35.0-35.9, adult: Secondary | ICD-10-CM

## 2023-05-21 ENCOUNTER — Other Ambulatory Visit: Payer: Self-pay | Admitting: *Deleted

## 2023-05-21 ENCOUNTER — Ambulatory Visit: Payer: Medicaid Other | Admitting: *Deleted

## 2023-05-21 ENCOUNTER — Other Ambulatory Visit: Payer: Self-pay

## 2023-05-21 ENCOUNTER — Ambulatory Visit: Payer: Medicaid Other | Attending: Maternal & Fetal Medicine

## 2023-05-21 VITALS — BP 128/82

## 2023-05-21 DIAGNOSIS — O24410 Gestational diabetes mellitus in pregnancy, diet controlled: Secondary | ICD-10-CM

## 2023-05-21 DIAGNOSIS — O3663X0 Maternal care for excessive fetal growth, third trimester, not applicable or unspecified: Secondary | ICD-10-CM

## 2023-05-21 DIAGNOSIS — O24419 Gestational diabetes mellitus in pregnancy, unspecified control: Secondary | ICD-10-CM | POA: Insufficient documentation

## 2023-05-21 DIAGNOSIS — O09899 Supervision of other high risk pregnancies, unspecified trimester: Secondary | ICD-10-CM | POA: Insufficient documentation

## 2023-05-21 DIAGNOSIS — O99210 Obesity complicating pregnancy, unspecified trimester: Secondary | ICD-10-CM | POA: Insufficient documentation

## 2023-05-21 DIAGNOSIS — E669 Obesity, unspecified: Secondary | ICD-10-CM

## 2023-05-21 DIAGNOSIS — O99213 Obesity complicating pregnancy, third trimester: Secondary | ICD-10-CM

## 2023-05-21 DIAGNOSIS — Z3A32 32 weeks gestation of pregnancy: Secondary | ICD-10-CM

## 2023-05-21 DIAGNOSIS — O09293 Supervision of pregnancy with other poor reproductive or obstetric history, third trimester: Secondary | ICD-10-CM | POA: Insufficient documentation

## 2023-05-21 NOTE — Progress Notes (Signed)
   PRENATAL VISIT NOTE  Subjective:  Wanda Erickson is a 28 y.o. [phone] at [redacted]w[redacted]d being seen today for ongoing prenatal care.  She is currently monitored for the following issues for this high-risk pregnancy and has Uterine hemorrhage; Supervision of other high risk pregnancy, antepartum; Obesity in pregnancy, antepartum; History of anxiety disorder; History of HELLP syndrome, currently pregnant; Elevated hemoglobin A1c; GDM (gestational diabetes mellitus); and Anemia during pregnancy in third trimester on their problem list.  Patient reports no complaints.  Contractions: Irregular. Vag. Bleeding: None.  Movement: Present. Denies leaking of fluid.   The following portions of the patient's history were reviewed and updated as appropriate: allergies, current medications, past family history, past medical history, past social history, past surgical history and problem list.   Objective:   Vitals:   05/20/23 0815  BP: 122/82  Pulse: 98  Weight: 206 lb (93.4 kg)    Fetal Status: Fetal Heart Rate (bpm): 128   Movement: Present     General:  Alert, oriented and cooperative. Patient is in no acute distress.  Skin: Skin is warm and dry. No rash noted.   Cardiovascular: Normal heart rate noted  Respiratory: Normal respiratory effort, no problems with respiration noted  Abdomen: Soft, gravid, appropriate for gestational age.  Pain/Pressure: Absent     Pelvic: Cervical exam deferred        Extremities: Normal range of motion.  Edema: None  Mental Status: Normal mood and affect. Normal behavior. Normal judgment and thought content.   Assessment and Plan:  Pregnancy: [id number] at [redacted]w[redacted]d 1. Supervision of other high risk pregnancy, antepartum (Primary) FHT normal  2. History of HELLP syndrome, currently pregnant On ASA 81mg  BP normal  3. Gestational diabetes mellitus (GDM) in third trimester, gestational diabetes method of control unspecified [place] controlled. A few elevated CBGs (<1/3).  Missed a few checks. Growth [place] scheduled.  4. Anemia during pregnancy in third trimester On Iron  5. Adult BMI 35.0-35.9 kg/sq m   Preterm labor symptoms and general obstetric precautions including but not limited to vaginal bleeding, contractions, leaking of fluid and fetal movement were reviewed in detail with the patient. Please refer to After Visit Summary for other counseling recommendations.   No follow-ups on file.  Future Appointments  Date Time Provider Department Center  05/21/2023  2:15 PM [facility] NURSE [facility]  05/21/2023  2:30 PM [facility]  06/01/2023  9:35 AM Gerrit Heck, CNM [facility] None  07/30/2023 10:45 AM [facility]    Levie Heritage, DO

## 2023-05-28 ENCOUNTER — Telehealth: Payer: Self-pay

## 2023-05-28 NOTE — Telephone Encounter (Signed)
Returned patients phone call to reschedule her appointment on 2/25.

## 2023-06-01 ENCOUNTER — Encounter: Payer: Medicaid Other | Admitting: Family Medicine

## 2023-06-15 ENCOUNTER — Other Ambulatory Visit (HOSPITAL_COMMUNITY)
Admission: RE | Admit: 2023-06-15 | Discharge: 2023-06-15 | Disposition: A | Discharge status: Home / Self Care | Source: Ambulatory Visit

## 2023-06-15 ENCOUNTER — Ambulatory Visit (INDEPENDENT_AMBULATORY_CARE_PROVIDER_SITE_OTHER)

## 2023-06-15 VITALS — BP 134/80 | HR 120 | Wt 208.0 lb

## 2023-06-15 DIAGNOSIS — Z3483 Encounter for supervision of other normal pregnancy, third trimester: Secondary | ICD-10-CM | POA: Diagnosis not present

## 2023-06-15 DIAGNOSIS — O09899 Supervision of other high risk pregnancies, unspecified trimester: Secondary | ICD-10-CM

## 2023-06-15 DIAGNOSIS — O24410 Gestational diabetes mellitus in pregnancy, diet controlled: Secondary | ICD-10-CM

## 2023-06-15 DIAGNOSIS — Z5320 Procedure and treatment not carried out because of patient's decision for unspecified reasons: Secondary | ICD-10-CM

## 2023-06-15 DIAGNOSIS — Z3A36 36 weeks gestation of pregnancy: Secondary | ICD-10-CM | POA: Diagnosis present

## 2023-06-15 DIAGNOSIS — Z3A35 35 weeks gestation of pregnancy: Secondary | ICD-10-CM

## 2023-06-15 DIAGNOSIS — O99210 Obesity complicating pregnancy, unspecified trimester: Secondary | ICD-10-CM | POA: Diagnosis not present

## 2023-06-15 NOTE — Progress Notes (Signed)
   HIGH-RISK PREGNANCY OFFICE VISIT  Patient name: Wanda Erickson MRN [id number]  Date of birth: 10-09-1995 Chief Complaint:   Routine Prenatal Visit (Yellow discharge since last week.  Denies odor, itching or burning.)  Subjective:   Wanda Erickson is a 28 y.o. [phone] female at [redacted]w[redacted]d with an Estimated Date of Delivery: 07/15/23 being seen today for ongoing management of a high-risk pregnancy aeb has Uterine hemorrhage; Supervision of other high risk pregnancy, antepartum; Obesity in pregnancy, antepartum; History of anxiety disorder; History of HELLP syndrome, currently pregnant; Elevated hemoglobin A1c; GDM (gestational diabetes mellitus); and Anemia during pregnancy in third trimester on their problem list.  Patient presents today, alone, with  some intermittent cramping .  Patient endorses fetal movement. Patient reports abdominal cramping, but states it is inconsistent and denies contractions.  Patient denies some vaginal discharge that is yellowish and "like my mucous plug." However, no odor or irriation. Patient denies other concerns including leaking of fluid and bleeding. No issues with urination, constipation, or diarrhea.    Contractions: Irregular. Vag. Bleeding: None.  Movement: Present.  Reviewed past medical,surgical, social, obstetrical and family history as well as problem list, medications and allergies.  Objective   Vitals:   06/15/23 1023  BP: 134/80  Pulse: (!) 120  Weight: 208 lb (94.3 kg)  Body mass index is 36.85 kg/m.  Total Weight Gain:9 lb (4.082 kg)         Physical Examination:   General appearance: Well appearing, and in no distress  Mental status: Alert, oriented to person, place, and time  Skin: Warm & dry  Cardiovascular: Normal heart rate noted  Respiratory: Normal respiratory effort, no distress  Abdomen: Soft, gravid, nontender, AGA with Fundal Height: 40 cm  Pelvic: Cervical exam performed  Dilation: 4 Effacement (%): 50 Station: -3     Extremities: Edema: None  Fetal Status: Fetal Heart Rate (bpm): 158  Movement: Present   No results found for this or any previous visit (from the past 24 hours).  Assessment & Plan:  High-risk pregnancy of a 28 y.o., [id number] at [redacted]w[redacted]d with an Estimated Date of Delivery: 07/15/23   1. Supervision of other high risk pregnancy, antepartum -Anticipatory guidance for upcoming appts. -Patient to schedule next appt in 1 weeks for an in-person visit.   2. Obesity in pregnancy, antepartum -TWG of 9lbs -Taking bASA -Growth [place] scheduled for 06/18/2023  3. [redacted] weeks gestation of pregnancy -Doing well. -Reviewed exam findings. -GBS collected today considering advanced dilation and multiparity status.  -Reassured that some mucoid discharge can be normal.  GC/CT pending.   4. Diet controlled gestational diabetes mellitus (GDM), antepartum -Overall normal range. -Reports fasting was 87 this am. -However, states last night was 132 after eating Reeces!  5.  Pap smear of cervix declined -Patient states she would like this performed PP.     Meds: No orders of the defined types were placed in this encounter.  Labs/procedures today:  Lab Orders         Strep Gp B NAA       Reviewed: Preterm labor symptoms and general obstetric precautions including but not limited to vaginal bleeding, contractions, leaking of fluid and fetal movement were reviewed in detail with the patient.  All questions were answered.  Follow-up: No follow-ups on file.  Orders Placed This Encounter  Procedures   Strep Gp B NAA   Cherre Robins MSN, CNM 06/15/2023

## 2023-06-16 LAB — CERVICOVAGINAL ANCILLARY ONLY
Chlamydia: NEGATIVE
Comment: NEGATIVE
Comment: NORMAL
Neisseria Gonorrhea: NEGATIVE

## 2023-06-17 LAB — STREP GP B NAA: Strep Gp B NAA: NEGATIVE

## 2023-06-18 ENCOUNTER — Ambulatory Visit: Payer: Medicaid Other | Attending: Obstetrics

## 2023-06-18 ENCOUNTER — Ambulatory Visit: Payer: Medicaid Other | Admitting: *Deleted

## 2023-06-18 VITALS — BP 132/81 | HR 111

## 2023-06-18 DIAGNOSIS — O09899 Supervision of other high risk pregnancies, unspecified trimester: Secondary | ICD-10-CM | POA: Insufficient documentation

## 2023-06-18 DIAGNOSIS — O3663X0 Maternal care for excessive fetal growth, third trimester, not applicable or unspecified: Secondary | ICD-10-CM | POA: Diagnosis not present

## 2023-06-18 DIAGNOSIS — O24419 Gestational diabetes mellitus in pregnancy, unspecified control: Secondary | ICD-10-CM | POA: Insufficient documentation

## 2023-06-18 DIAGNOSIS — E669 Obesity, unspecified: Secondary | ICD-10-CM | POA: Diagnosis not present

## 2023-06-18 DIAGNOSIS — O24410 Gestational diabetes mellitus in pregnancy, diet controlled: Secondary | ICD-10-CM

## 2023-06-18 DIAGNOSIS — Z3A36 36 weeks gestation of pregnancy: Secondary | ICD-10-CM

## 2023-06-18 DIAGNOSIS — O99213 Obesity complicating pregnancy, third trimester: Secondary | ICD-10-CM

## 2023-06-18 DIAGNOSIS — O09293 Supervision of pregnancy with other poor reproductive or obstetric history, third trimester: Secondary | ICD-10-CM

## 2023-06-22 ENCOUNTER — Inpatient Hospital Stay (HOSPITAL_COMMUNITY)
Admission: AD | Admit: 2023-06-22 | Discharge: 2023-06-22 | Disposition: A | Discharge status: Home / Self Care | Attending: Obstetrics and Gynecology | Admitting: Obstetrics and Gynecology

## 2023-06-22 ENCOUNTER — Encounter (HOSPITAL_COMMUNITY): Payer: Self-pay | Admitting: Obstetrics and Gynecology

## 2023-06-22 DIAGNOSIS — R102 Pelvic and perineal pain: Secondary | ICD-10-CM | POA: Diagnosis present

## 2023-06-22 DIAGNOSIS — O479 False labor, unspecified: Secondary | ICD-10-CM

## 2023-06-22 DIAGNOSIS — Z3A36 36 weeks gestation of pregnancy: Secondary | ICD-10-CM | POA: Insufficient documentation

## 2023-06-22 DIAGNOSIS — N898 Other specified noninflammatory disorders of vagina: Secondary | ICD-10-CM | POA: Insufficient documentation

## 2023-06-22 DIAGNOSIS — R030 Elevated blood-pressure reading, without diagnosis of hypertension: Secondary | ICD-10-CM | POA: Diagnosis not present

## 2023-06-22 DIAGNOSIS — O26893 Other specified pregnancy related conditions, third trimester: Secondary | ICD-10-CM | POA: Diagnosis not present

## 2023-06-22 LAB — CBC
HCT: 30.1 % — ABNORMAL LOW (ref 36.0–46.0)
Hemoglobin: 9.2 g/dL — ABNORMAL LOW (ref 12.0–15.0)
MCH: 22.9 pg — ABNORMAL LOW (ref 26.0–34.0)
MCHC: 30.6 g/dL (ref 30.0–36.0)
MCV: 74.9 fL — ABNORMAL LOW (ref 80.0–100.0)
Platelets: 323 10*3/uL (ref 150–400)
RBC: 4.02 MIL/uL (ref 3.87–5.11)
RDW: 16.6 % — ABNORMAL HIGH (ref 11.5–15.5)
WBC: 10.3 10*3/uL (ref 4.0–10.5)
nRBC: 0 % (ref 0.0–0.2)

## 2023-06-22 LAB — COMPREHENSIVE METABOLIC PANEL
ALT: 10 U/L (ref 0–44)
AST: 15 U/L (ref 15–41)
Albumin: 2.3 g/dL — ABNORMAL LOW (ref 3.5–5.0)
Alkaline Phosphatase: 76 U/L (ref 38–126)
Anion gap: 8 (ref 5–15)
BUN: 10 mg/dL (ref 6–20)
CO2: 20 mmol/L — ABNORMAL LOW (ref 22–32)
Calcium: 8.5 mg/dL — ABNORMAL LOW (ref 8.9–10.3)
Chloride: 109 mmol/L (ref 98–111)
Creatinine, Ser: 0.78 mg/dL (ref 0.44–1.00)
GFR, Estimated: >60 mL/min (ref >60)
Glucose, Bld: 104 mg/dL — ABNORMAL HIGH (ref 70–99)
Potassium: 3.8 mmol/L (ref 3.5–5.1)
Sodium: 137 mmol/L (ref 135–145)
Total Bilirubin: 0.4 mg/dL (ref 0.0–1.2)
Total Protein: 5.9 g/dL — ABNORMAL LOW (ref 6.5–8.1)

## 2023-06-22 LAB — PROTEIN / CREATININE RATIO, URINE
Creatinine, Urine: 85 mg/dL
Protein Creatinine Ratio: 0.11 mg/mg{creat} (ref 0.00–0.15)
Total Protein, Urine: 9 mg/dL

## 2023-06-22 LAB — POCT FERN TEST: POCT Fern Test: NEGATIVE

## 2023-06-22 LAB — RUPTURE OF MEMBRANE (ROM)PLUS: Rom Plus: NEGATIVE

## 2023-06-22 MED ORDER — FERROUS SULFATE 325 (65 FE) MG PO TABS: 325.0000 mg | ORAL_TABLET | ORAL | 0 refills | Status: DC

## 2023-06-22 NOTE — MAU Note (Signed)
 Wanda Erickson is a 28 y.o. at [redacted]w[redacted]d here in MAU reporting: has been having some pelvic pain and cramping.  Some contractions- but pretty far apart.  Mostly the pelvic pain, hurts to walk or sit, usually just tries to push through it. ? Leaking- can't tell if leaking or peeing on herself.  No bleeding.  Reports +FM.   Onset of complaint: today has been really bad Pain score: pelvic : 8,  cramping: 6 Vitals:   06/22/23 1701 06/22/23 1705  BP: (!) 140/84 130/80  Pulse: (!) 124 (!) 116  Resp: 20   Temp: 98.4 F (36.9 C)   SpO2: 100%      FHT:144 Lab orders placed from triage:  fern, urine     Last visit was already about 4cm/50%    BP recheck - ? contraction with first 130/80 P 116

## 2023-06-22 NOTE — MAU Provider Note (Signed)
 Chief Complaint:  Pelvic Pain, Abdominal Pain, and Rupture of Membranes   HPI: Wanda Erickson is a 28 y.o. [id number] at [redacted]w[redacted]d by LMP who presents to maternity admissions reporting intermittent contractions since around 0800. Patient reports that she was already 4 cm at her last appointment so she wanted to come in and make sure she was not further dilated. Patient reports the contractions are 6/10 lower abdomen. Patient reports leaking clear fluid 1 day ago. Patient reports she does wear a pad and changes it multiple times a day. She reports she "likes to stay dry" Patient reports when standing burning pain in her vagina.  She reports good fetal movement, denies vaginal bleeding, vaginal itching/burning, urinary symptoms, h/a, dizziness, n/v, or fever/chills.    Pelvic Pain Associated symptoms include abdominal pain. Pertinent negatives include no chills, fever, nausea or vomiting.  Abdominal Pain The primary symptoms of the illness include abdominal pain and vaginal discharge. The primary symptoms of the illness do not include fever, nausea, vomiting, diarrhea, dysuria or vaginal bleeding.  The vaginal discharge is not associated with dysuria.  Symptoms associated with the illness do not include chills, constipation, urgency or frequency.    Past Medical History: Past Medical History:  Diagnosis Date   Anemia due to acute blood loss 11/23/2017   Appendicitis 11/13/2021   Appendicitis, acute 11/13/2021   Asthma    "As a kid"   Diabetes mellitus without complication (HCC)    Incomplete abortion 11/23/2017   Incomplete abortion with delayed or excessive hemorrhage 11/23/2017   Miscarriage    No known health problems     Past obstetric history: OB History  Gravida Para Term Preterm AB Living  4 2 1 1 1 2   SAB IAB Ectopic Multiple Live Births  1   1 2     # Outcome Date GA Lbr Len/2nd Weight Sex Type Anes PTL Lv  4 Current           3 Term 2023 [redacted]w[redacted]d   F Vag-Spont EPI Y LIV  2  Preterm 2020 [redacted]w[redacted]d   M Vag-Spont EPI N LIV  1A SAB 2019          1B SAB 2019            Past Surgical History: Past Surgical History:  Procedure Laterality Date   APPENDECTOMY     DILATION AND EVACUATION N/A 11/23/2017   Procedure: DILATATION AND EVACUATION;  Surgeon: Conard Novak, MD;  Location: [facility] ORS;  Service: Gynecology;  Laterality: N/A;   LAPAROSCOPIC APPENDECTOMY N/A 11/13/2021   Procedure: APPENDECTOMY LAPAROSCOPIC;  Surgeon: Franky Macho, MD;  Location: [facility];  Service: General;  Laterality: N/A;    Family History: Family History  Problem Relation Age of Onset   Drug abuse Father    Alcohol abuse Father    Schizophrenia Father    Heart disease Sister    Graves' disease Sister    Asthma Neg Hx    Cancer Neg Hx    Diabetes Neg Hx    Hypertension Neg Hx     Social History: Social History   Tobacco Use   Smoking status: Never   Smokeless tobacco: Never  Vaping Use   Vaping status: Never Used  Substance Use Topics   Alcohol use: Not Currently   Drug use: Never    Allergies:  Allergies  Allergen Reactions   Septra [Sulfamethoxazole-Trimethoprim]     Meds:  No medications prior to admission.    ROS:  Review of Systems  Constitutional:  Negative for chills and fever.  Gastrointestinal:  Positive for abdominal pain. Negative for constipation, diarrhea, nausea and vomiting.  Genitourinary:  Positive for pelvic pain and vaginal discharge. Negative for difficulty urinating, dysuria, frequency, urgency and vaginal bleeding.  Neurological:  Negative for dizziness.     I have reviewed patient's Past Medical Hx, Surgical Hx, Family Hx, Social Hx, medications and allergies.   Physical Exam  Patient Vitals for the past 24 hrs:  BP Temp Temp src Pulse Resp SpO2 Height Weight  06/22/23 2001 126/80 -- -- (!) 102 -- -- -- --  06/22/23 1931 138/82 -- -- (!) 107 -- -- -- --  06/22/23 1916 (!) 134/94 -- -- (!) 116 -- -- -- --  06/22/23 1901 138/78 -- --  (!) 110 -- -- -- --  06/22/23 1846 (!) 142/77 -- -- (!) 112 -- -- -- --  06/22/23 1831 (!) 151/80 -- -- (!) 123 -- -- -- --  06/22/23 1817 127/79 -- -- (!) 114 -- -- -- --  06/22/23 1802 (!) 128/95 -- -- (!) 115 -- -- -- --  06/22/23 1725 126/82 -- -- (!) 124 -- -- -- --  06/22/23 1705 130/80 -- -- (!) 116 -- -- -- --  06/22/23 1701 (!) 140/84 98.4 F (36.9 C) Oral (!) 124 20 100 % 5\' 3"  (1.6 m) 97 kg   Constitutional: Well-developed, well-nourished female in no acute distress.  Cardiovascular: normal rate and rhythm, no extra sounds or rubs Respiratory: normal effort lungs are clear bilaterally  GI: Abd soft, non-tender, gravid appropriate for gestational age.  MS: Extremities nontender, no edema, normal ROM Neurologic: Alert and oriented x 4.  GU: Neg CVAT.  PELVIC EXAM: deferred  Bimanual exam:  Dilation: 4 Effacement (%): 50 Cervical Position: Middle Station: -2 Presentation: Vertex Exam by:: K.Wilson,RN    Labs: Reviewed and interpreted Results for orders placed or performed during the hospital encounter of 06/22/23 (from the past 24 hours)  Fern Test     Status: Normal   Collection Time: 06/22/23  5:40 PM  Result Value Ref Range   POCT Fern Test Negative = intact amniotic membranes   Rupture of Membrane (ROM) Plus     Status: None   Collection Time: 06/22/23  6:00 PM  Result Value Ref Range   Rom Plus NEGATIVE   Protein / creatinine ratio, urine     Status: None   Collection Time: 06/22/23  6:32 PM  Result Value Ref Range   Creatinine, Urine 85 mg/dL   Total Protein, Urine 9 mg/dL   Protein Creatinine Ratio 0.11 0.00 - 0.15 mg/mg[Cre]  Comprehensive metabolic panel     Status: Abnormal   Collection Time: 06/22/23  6:57 PM  Result Value Ref Range   Sodium 137 135 - 145 mmol/L   Potassium 3.8 3.5 - 5.1 mmol/L   Chloride 109 98 - 111 mmol/L   CO2 20 (L) 22 - 32 mmol/L   Glucose, Bld 104 (H) 70 - 99 mg/dL   BUN 10 6 - 20 mg/dL   Creatinine, Ser 8.29 0.44 - 1.00  mg/dL   Calcium 8.5 (L) 8.9 - 10.3 mg/dL   Total Protein 5.9 (L) 6.5 - 8.1 g/dL   Albumin 2.3 (L) 3.5 - 5.0 g/dL   AST 15 15 - 41 U/L   ALT 10 0 - 44 U/L   Alkaline Phosphatase 76 38 - 126 U/L   Total Bilirubin 0.4 0.0 - 1.2 mg/dL   GFR, Estimated >  60 >60 mL/min   Anion gap 8 5 - 15  CBC     Status: Abnormal   Collection Time: 06/22/23  6:57 PM  Result Value Ref Range   WBC 10.3 4.0 - 10.5 K/uL   RBC 4.02 3.87 - 5.11 MIL/uL   Hemoglobin 9.2 (L) 12.0 - 15.0 g/dL   HCT 16.1 (L) 09.6 - 04.5 %   MCV 74.9 (L) 80.0 - 100.0 fL   MCH 22.9 (L) 26.0 - 34.0 pg   MCHC 30.6 30.0 - 36.0 g/dL   RDW 40.9 (H) 81.1 - 91.4 %   Platelets 323 150 - 400 K/uL   nRBC 0.0 0.0 - 0.2 %      Imaging:  [place] MFM OB FOLLOW UP Result Date: 06/18/2023 ----------------------------------------------------------------------  OBSTETRICS REPORT                       (Signed Final 06/18/2023 03:42 pm) ---------------------------------------------------------------------- Patient Info  ID #:       [id number]                          D.O.B.:  Jul 02, 1995 (27 yrs)(F)  Name:       Jimmy Footman                Visit Date: 06/18/2023 02:56 pm ---------------------------------------------------------------------- Performed By  Attending:        Ma Rings MD         Ref. Address:     [facility]  Performed By:     Eden Lathe BS      [facility]                    RD[facility] RVT                                 [facility]  Referred By:      Celedonio Savage MD ---------------------------------------------------------------------- Orders  #  Description                           Code        Ordered By  1  [place] MFM OB FOLLOW UP                   [id number]    Rosana Hoes ----------------------------------------------------------------------  #  Order #  Accession #                Episode #  1  [id number]                   [id number]                 [id number] ---------------------------------------------------------------------- Indications  Gestational diabetes in pregnancy, diet        O24.410  controlled  Obesity complicating pregnancy, third          O99.213  trimester (BMI 37)  Large for gestational age fetus affecting      O36.60X0  management of mother  Poor obstetric history: Previous               O09.299  preeclampsia / HELLP  Encounter for other antenatal screening        Z36.2  follow-up  Neg Horizon / Insufficient DNA NIPS  [redacted] weeks gestation of pregnancy                Z3A.36 ---------------------------------------------------------------------- Fetal Evaluation  Num Of Fetuses:         1  Fetal Heart Rate(bpm):  138  Cardiac Activity:       Observed  Presentation:           Cephalic  Placenta:               Posterior  P. Cord Insertion:      Previously seen  Amniotic Fluid  AFI FV:      Within normal limits  AFI Sum(cm)     %Tile       Largest Pocket(cm)  18.41           69          6.61  RUQ(cm)       RLQ(cm)       LUQ(cm)        LLQ(cm)  6.61          2.81          3.85           5.14 ---------------------------------------------------------------------- Biometry  BPD:      92.2  mm     G. Age:  37w 3d         89  %    CI:        78.87   %    70 - 86                                                          FL/HC:      20.5   %    20.1 - 22.1  HC:      328.3  mm     G. Age:  37w 2d         48  %    HC/AC:      0.97        0.93 - 1.11  AC:      338.3  mm     G. Age:  37w 5d         93  %    FL/BPD:     73.1   %    71 - 87  FL:       67.4  mm  G. Age:  34w 5d         13  %    FL/AC:      19.9   %    20 - 24  LV:        4.7  mm  Est. FW:    3069  gm    6 lb 12 oz      73  % ---------------------------------------------------------------------- OB History  Blood Type:   A+  Maternal Racial/Ethnic Group:   Hispanic  Gravidity:    4         Term:   1         Prem:   1        SAB:   1  TOP:          0       Ectopic:  0        Living: 2 ---------------------------------------------------------------------- Gestational Age  LMP:           36w 1d        Date:  10/08/22                   EDD:   07/15/23  U/S Today:     36w 6d                                        EDD:   07/10/23  Best:          36w 1d     Det. By:  LMP  (10/08/22)          EDD:   07/15/23 ---------------------------------------------------------------------- Anatomy  Cranium:               Appears normal         Aortic Arch:            Previously seen  Cavum:                 Appears normal         Ductal Arch:            Previously seen  Ventricles:            Appears normal         Diaphragm:              Appears normal  Choroid Plexus:        Previously seen        Stomach:                Appears normal, left                                                                        sided  Cerebellum:            Previously seen        Abdomen:                Previously seen  Posterior Fossa:       Previously seen        Abdominal Wall:         Previously seen  Face:                  Orbits and profile     Cord Vessels:           Previously seen                         previously seen  Lips:                  Previously seen        Kidneys:                Appear normal  Thoracic:              Previously seen        Bladder:                Appears normal  Heart:                 Appears normal         Spine:                  Previously seen                         (4CH, axis, and                         situs)  RVOT:                  Appears normal         Upper Extremities:      Previously seen  LVOT:                  Appears normal         Lower Extremities:      Previously seen  Other:  Fetal anatomic survey complete on prior scans. ---------------------------------------------------------------------- Cervix Uterus Adnexa  Cervix  Normal appearance by transabdominal scan  Uterus  No abnormality visualized.   Right Ovary  Not visualized.  Left Ovary  Not visualized.  Cul De Sac  No free fluid seen.  Adnexa  No abnormality visualized ---------------------------------------------------------------------- Comments  This patient was seen for a follow up growth scan due to diet-  controlled gestational diabetes.  She denies any problems  since her last exam and reports normal glycemic control.  She was informed that the fetal growth and amniotic fluid  level appears appropriate for her gestational age.  Due to gestational diabetes, delivery is recommended at  around 39 weeks.  As the fetal growth is within normal limits, no further exams  were scheduled in our office. ----------------------------------------------------------------------                   Ma Rings, MD Electronically Signed Final Report   06/18/2023 03:42 pm ----------------------------------------------------------------------    MAU Course/MDM: high Orders Placed This Encounter  Procedures   Rupture of Membrane (ROM) Plus   Comprehensive metabolic panel   CBC   Protein / creatinine ratio, urine   Fern Test   Discharge patient Discharge disposition: 01-Home or Self Care; Discharge patient date: 06/22/2023    ROM+ negative  CMP-liver enzymes WNL CBC- noted anemia HGB 9.2 order sent to pharmacy for supplemental IRON  PCR- negative  Cycling blood pressure- noted for several elevated pressures mostly 140's/90's, the readings are not 4 hours apart so does not currently  meet the criteria for gestational hypertension. Patient to follow up in the office for a blood pressure re-check. Patient is asymptomatic otherwise at this time.  Cervical exam- 4/50/-2 unchanged after 2 hours not indicating active labor Fetal monitoring reviewed and is reactive FHT:  Baseline 145 , moderate variability, accelerations present, no decelerations Contractions: occ ui    Pt discharge with strict return precautions.  Assessment:  1. Elevated blood pressure  reading without diagnosis of hypertension   2. Vaginal discharge during pregnancy in third trimester   3. Uterine contractions   4. [redacted] weeks gestation of pregnancy       Plan: Patient will begin to take iron supplement every other day. Patient was counseled that this medication is best to be taken with orange juice and may cause constipation. If so patient verbalizes understanding to increase fluid and fiber in diet.  Patient is to follow up in office for a repeat blood pressure to confirm or rule out definitive diagnosis of gestational hypertension Discharge home in stable condition Labor precautions and fetal kick counts given   Follow-up Information     [facility] Follow up.   Why: for prenatal care Contact information: [address] [place] [address]        [facility] Follow up.   Specialty: Obstetrics and Gynecology Why: As needed for emergency Contact information: [address] [place] [address] [phone]               Future Appointments  Date Time Provider Department Center  06/28/2023  8:35 AM Adam Phenix, MD [facility] None  07/07/2023  8:15 AM Adam Phenix, MD [facility] None  07/30/2023 10:45 AM [facility]ALTH CLINICIAN [facility]     Del Aire, FNP-Student  06/22/2023 8:26 PM

## 2023-06-23 ENCOUNTER — Telehealth: Payer: Self-pay

## 2023-06-23 NOTE — Telephone Encounter (Signed)
 Left voicemail for patient to call back and schedule a BP check.

## 2023-06-23 NOTE — Telephone Encounter (Signed)
-----   Message from Federico Flake sent at 06/22/2023  8:02 PM EDT ----- Regarding: BP check This patient was seen in MAU - She had some elevated blood pressures but they were not 4 hrs apart and labs were normal.   Needs BP check on Thursday or Friday  Thanks Dr Alvester Morin

## 2023-06-24 NOTE — Telephone Encounter (Signed)
 Called patient to schedule a BP check for tomorrow. Left message for patient to call the office back.  Kyvon Hu l Dakarri Kessinger, CMA

## 2023-06-25 ENCOUNTER — Encounter (HOSPITAL_COMMUNITY): Payer: Self-pay | Admitting: Obstetrics & Gynecology

## 2023-06-25 ENCOUNTER — Inpatient Hospital Stay (HOSPITAL_COMMUNITY)
Admission: AD | Admit: 2023-06-25 | Discharge: 2023-06-25 | Disposition: A | Discharge status: Home / Self Care | Attending: Obstetrics & Gynecology | Admitting: Obstetrics & Gynecology

## 2023-06-25 DIAGNOSIS — O471 False labor at or after 37 completed weeks of gestation: Secondary | ICD-10-CM | POA: Insufficient documentation

## 2023-06-25 DIAGNOSIS — O09899 Supervision of other high risk pregnancies, unspecified trimester: Secondary | ICD-10-CM

## 2023-06-25 DIAGNOSIS — Z3A37 37 weeks gestation of pregnancy: Secondary | ICD-10-CM | POA: Insufficient documentation

## 2023-06-25 NOTE — MAU Note (Signed)
.  Wanda Erickson is a 28 y.o. at [redacted]w[redacted]d here in MAU reporting contractions since 1700. Denies VB or LOF. Has been 4cm for week or so. States baby has not moved as much today as usual .  LMP: n/a Onset of complaint: 1700 Pain score: 7 Vitals:   06/25/23 2101 06/25/23 2104  BP:  136/88  Pulse: (!) 119   Resp: 18   Temp: 98.2 F (36.8 C)   SpO2: 99%      FHT: 140  Lab orders placed from triage: labor eval

## 2023-06-26 ENCOUNTER — Inpatient Hospital Stay (HOSPITAL_COMMUNITY)
Admission: AD | Admit: 2023-06-26 | Discharge: 2023-06-26 | Disposition: A | Discharge status: Home / Self Care | Payer: Self-pay | Attending: Obstetrics & Gynecology | Admitting: Obstetrics & Gynecology

## 2023-06-26 ENCOUNTER — Encounter (HOSPITAL_COMMUNITY): Payer: Self-pay | Admitting: Obstetrics & Gynecology

## 2023-06-26 DIAGNOSIS — O471 False labor at or after 37 completed weeks of gestation: Secondary | ICD-10-CM

## 2023-06-26 DIAGNOSIS — Z3689 Encounter for other specified antenatal screening: Secondary | ICD-10-CM

## 2023-06-26 DIAGNOSIS — Z3A37 37 weeks gestation of pregnancy: Secondary | ICD-10-CM

## 2023-06-26 MED ORDER — MORPHINE SULFATE (PF) 4 MG/ML IV SOLN
4.0000 mg | Freq: Once | INTRAVENOUS | Status: AC
  Administered 2023-06-26: 4 mg via INTRAMUSCULAR
  Filled 2023-06-26: qty 1

## 2023-06-26 MED ORDER — ZOLPIDEM TARTRATE 5 MG PO TABS
5.0000 mg | ORAL_TABLET | Freq: Once | ORAL | Status: AC
Start: 2023-06-26 — End: 2023-06-26
  Administered 2023-06-26: 5 mg via ORAL
  Filled 2023-06-26: qty 1

## 2023-06-26 NOTE — MAU Provider Note (Signed)
 S: Ms. Wanda Erickson is a 28 y.o. [phone] at [redacted]w[redacted]d  who presents to MAU today for labor evaluation.   Nurse reports cervix remains unchanged, but patient requesting pain medication.   Cervical exam by RN:  Dilation: 4 Effacement (%): 70 Station: -3 Exam by:: Sigmund Hazel, RN  Fetal Monitoring: Baseline: 135 Variability: Moderate Accelerations: Present 15x15 Decelerations: None Contractions: Irregular  MDM Discussed patient with RN. NST reviewed.   A: SIUP at [redacted]w[redacted]d  False labor Cat I FT  P: Discussed morphine and ambien for pain and to assist with sleep. Patient agreeable. Precautions reviewed.  Discharge home Patient to follow-up with primary office as scheduled. Patient may return to MAU as needed or when in labor.  Gerrit Heck, CNM 06/26/2023 2:55 AM

## 2023-06-26 NOTE — MAU Note (Addendum)
..  Wanda Erickson is a 28 y.o. at 104w2d here in MAU returns: intense contractions that are now every 2-3 minutes. Denies vaginal bleeding or leaking of fluid. Reports decreased fetal movement, last movement was felt 30-40 minutes ago.

## 2023-06-28 ENCOUNTER — Inpatient Hospital Stay (HOSPITAL_COMMUNITY): Admitting: Anesthesiology

## 2023-06-28 ENCOUNTER — Encounter (HOSPITAL_COMMUNITY): Payer: Self-pay | Admitting: Family Medicine

## 2023-06-28 ENCOUNTER — Inpatient Hospital Stay (HOSPITAL_COMMUNITY)
Admission: AD | Admit: 2023-06-28 | Discharge: 2023-06-30 | DRG: 807 | Disposition: A | Discharge status: Home / Self Care | Payer: Self-pay | Attending: Family Medicine | Admitting: Family Medicine

## 2023-06-28 ENCOUNTER — Ambulatory Visit (INDEPENDENT_AMBULATORY_CARE_PROVIDER_SITE_OTHER): Admitting: Obstetrics & Gynecology

## 2023-06-28 VITALS — BP 140/90 | HR 110 | Wt 214.0 lb

## 2023-06-28 DIAGNOSIS — O134 Gestational [pregnancy-induced] hypertension without significant proteinuria, complicating childbirth: Secondary | ICD-10-CM | POA: Diagnosis present

## 2023-06-28 DIAGNOSIS — Z3A37 37 weeks gestation of pregnancy: Secondary | ICD-10-CM | POA: Diagnosis not present

## 2023-06-28 DIAGNOSIS — O24419 Gestational diabetes mellitus in pregnancy, unspecified control: Secondary | ICD-10-CM | POA: Diagnosis present

## 2023-06-28 DIAGNOSIS — O139 Gestational [pregnancy-induced] hypertension without significant proteinuria, unspecified trimester: Secondary | ICD-10-CM | POA: Diagnosis present

## 2023-06-28 DIAGNOSIS — O99214 Obesity complicating childbirth: Secondary | ICD-10-CM | POA: Diagnosis not present

## 2023-06-28 DIAGNOSIS — O09299 Supervision of pregnancy with other poor reproductive or obstetric history, unspecified trimester: Secondary | ICD-10-CM

## 2023-06-28 DIAGNOSIS — Z7982 Long term (current) use of aspirin: Secondary | ICD-10-CM | POA: Diagnosis not present

## 2023-06-28 DIAGNOSIS — O09899 Supervision of other high risk pregnancies, unspecified trimester: Principal | ICD-10-CM

## 2023-06-28 DIAGNOSIS — O24420 Gestational diabetes mellitus in childbirth, diet controlled: Secondary | ICD-10-CM | POA: Diagnosis present

## 2023-06-28 DIAGNOSIS — O133 Gestational [pregnancy-induced] hypertension without significant proteinuria, third trimester: Secondary | ICD-10-CM

## 2023-06-28 HISTORY — DX: Tachycardia, unspecified: R00.0

## 2023-06-28 LAB — COMPREHENSIVE METABOLIC PANEL
ALT: 12 U/L (ref 0–44)
AST: 18 U/L (ref 15–41)
Albumin: 2.5 g/dL — ABNORMAL LOW (ref 3.5–5.0)
Alkaline Phosphatase: 92 U/L (ref 38–126)
Anion gap: 9 (ref 5–15)
BUN: 8 mg/dL (ref 6–20)
CO2: 18 mmol/L — ABNORMAL LOW (ref 22–32)
Calcium: 8.6 mg/dL — ABNORMAL LOW (ref 8.9–10.3)
Chloride: 110 mmol/L (ref 98–111)
Creatinine, Ser: 0.79 mg/dL (ref 0.44–1.00)
GFR, Estimated: >60 mL/min (ref >60)
Glucose, Bld: 105 mg/dL — ABNORMAL HIGH (ref 70–99)
Potassium: 3.9 mmol/L (ref 3.5–5.1)
Sodium: 137 mmol/L (ref 135–145)
Total Bilirubin: 0.4 mg/dL (ref 0.0–1.2)
Total Protein: 6.1 g/dL — ABNORMAL LOW (ref 6.5–8.1)

## 2023-06-28 LAB — CBC
HCT: 30.7 % — ABNORMAL LOW (ref 36.0–46.0)
HCT: 29.7 % — ABNORMAL LOW (ref 36.0–46.0)
Hemoglobin: 9.3 g/dL — ABNORMAL LOW (ref 12.0–15.0)
Hemoglobin: 8.9 g/dL — ABNORMAL LOW (ref 12.0–15.0)
MCH: 22.8 pg — ABNORMAL LOW (ref 26.0–34.0)
MCH: 22.5 pg — ABNORMAL LOW (ref 26.0–34.0)
MCHC: 30.3 g/dL (ref 30.0–36.0)
MCHC: 30 g/dL (ref 30.0–36.0)
MCV: 75.2 fL — ABNORMAL LOW (ref 80.0–100.0)
MCV: 75.2 fL — ABNORMAL LOW (ref 80.0–100.0)
Platelets: 329 10*3/uL (ref 150–400)
Platelets: 307 10*3/uL (ref 150–400)
RBC: 4.08 MIL/uL (ref 3.87–5.11)
RBC: 3.95 MIL/uL (ref 3.87–5.11)
RDW: 17 % — ABNORMAL HIGH (ref 11.5–15.5)
RDW: 16.8 % — ABNORMAL HIGH (ref 11.5–15.5)
WBC: 12.1 10*3/uL — ABNORMAL HIGH (ref 4.0–10.5)
WBC: 14.4 10*3/uL — ABNORMAL HIGH (ref 4.0–10.5)
nRBC: 0 % (ref 0.0–0.2)
nRBC: 0 % (ref 0.0–0.2)

## 2023-06-28 LAB — TYPE AND SCREEN
ABO/RH(D): A POS
Antibody Screen: NEGATIVE

## 2023-06-28 LAB — PROTEIN / CREATININE RATIO, URINE
Creatinine, Urine: 152 mg/dL
Protein Creatinine Ratio: 0.14 mg/mg{creat} (ref 0.00–0.15)
Total Protein, Urine: 22 mg/dL

## 2023-06-28 LAB — GLUCOSE, CAPILLARY
Glucose-Capillary: 79 mg/dL (ref 70–99)
Glucose-Capillary: 71 mg/dL (ref 70–99)

## 2023-06-28 LAB — RPR: RPR Ser Ql: NONREACTIVE

## 2023-06-28 MED ORDER — DIPHENHYDRAMINE HCL 25 MG PO CAPS: 25.0000 mg | ORAL_CAPSULE | Freq: Four times a day (QID) | ORAL | Status: DC | PRN

## 2023-06-28 MED ORDER — NIFEDIPINE ER OSMOTIC RELEASE 30 MG PO TB24
30.0000 mg | ORAL_TABLET | Freq: Every day | ORAL | Status: DC
  Administered 2023-06-29: 30 mg via ORAL
  Filled 2023-06-28: qty 1

## 2023-06-28 MED ORDER — EPHEDRINE 5 MG/ML INJ: 10.0000 mg | INTRAVENOUS | Status: DC | PRN

## 2023-06-28 MED ORDER — ACETAMINOPHEN 325 MG PO TABS: 650.0000 mg | ORAL_TABLET | ORAL | Status: DC | PRN

## 2023-06-28 MED ORDER — PHENYLEPHRINE 80 MCG/ML (10ML) SYRINGE FOR IV PUSH (FOR BLOOD PRESSURE SUPPORT): 80.0000 ug | PREFILLED_SYRINGE | INTRAVENOUS | Status: DC | PRN

## 2023-06-28 MED ORDER — SENNOSIDES-DOCUSATE SODIUM 8.6-50 MG PO TABS
2.0000 | ORAL_TABLET | ORAL | Status: DC
  Administered 2023-06-29 – 2023-06-30 (×2): 2 via ORAL
  Filled 2023-06-28 (×2): qty 2

## 2023-06-28 MED ORDER — SIMETHICONE 80 MG PO CHEW: 80.0000 mg | CHEWABLE_TABLET | ORAL | Status: DC | PRN

## 2023-06-28 MED ORDER — LACTATED RINGERS IV SOLN: 500.0000 mL | INTRAVENOUS | Status: DC | PRN

## 2023-06-28 MED ORDER — SOD CITRATE-CITRIC ACID 500-334 MG/5ML PO SOLN: 30.0000 mL | ORAL | Status: DC | PRN

## 2023-06-28 MED ORDER — LIDOCAINE-EPINEPHRINE (PF) 1.5 %-1:200000 IJ SOLN
INTRAMUSCULAR | Status: DC | PRN
  Administered 2023-06-28: 3 mL via EPIDURAL

## 2023-06-28 MED ORDER — OXYCODONE-ACETAMINOPHEN 5-325 MG PO TABS: 1.0000 | ORAL_TABLET | ORAL | Status: DC | PRN

## 2023-06-28 MED ORDER — LIDOCAINE HCL (PF) 1 % IJ SOLN: 30.0000 mL | INTRAMUSCULAR | Status: DC | PRN

## 2023-06-28 MED ORDER — LACTATED RINGERS IV SOLN: INTRAVENOUS | Status: DC

## 2023-06-28 MED ORDER — SODIUM CHLORIDE 0.9 % IV SOLN: 250.0000 mL | INTRAVENOUS | Status: DC | PRN

## 2023-06-28 MED ORDER — SODIUM CHLORIDE 0.9% FLUSH: 3.0000 mL | INTRAVENOUS | Status: DC | PRN

## 2023-06-28 MED ORDER — OXYCODONE-ACETAMINOPHEN 5-325 MG PO TABS: 2.0000 | ORAL_TABLET | ORAL | Status: DC | PRN

## 2023-06-28 MED ORDER — LACTATED RINGERS IV SOLN
500.0000 mL | Freq: Once | INTRAVENOUS | Status: AC
  Administered 2023-06-28: 500 mL via INTRAVENOUS

## 2023-06-28 MED ORDER — IBUPROFEN 800 MG PO TABS
800.0000 mg | ORAL_TABLET | Freq: Three times a day (TID) | ORAL | Status: DC
  Administered 2023-06-28 – 2023-06-30 (×5): 800 mg via ORAL
  Filled 2023-06-28 (×5): qty 1

## 2023-06-28 MED ORDER — ONDANSETRON HCL 4 MG/2ML IJ SOLN: 4.0000 mg | INTRAMUSCULAR | Status: DC | PRN

## 2023-06-28 MED ORDER — PRENATAL MULTIVITAMIN CH
1.0000 | ORAL_TABLET | Freq: Every day | ORAL | Status: DC
  Administered 2023-06-29: 1 via ORAL
  Filled 2023-06-28: qty 1

## 2023-06-28 MED ORDER — BENZOCAINE-MENTHOL 20-0.5 % EX AERO: 1.0000 | INHALATION_SPRAY | CUTANEOUS | Status: DC | PRN

## 2023-06-28 MED ORDER — OXYTOCIN-SODIUM CHLORIDE 30-0.9 UT/500ML-% IV SOLN
2.5000 [IU]/h | INTRAVENOUS | Status: DC
  Administered 2023-06-28: 2.5 [IU]/h via INTRAVENOUS

## 2023-06-28 MED ORDER — DIBUCAINE (PERIANAL) 1 % EX OINT: 1.0000 | TOPICAL_OINTMENT | CUTANEOUS | Status: DC | PRN

## 2023-06-28 MED ORDER — POTASSIUM CHLORIDE CRYS ER 20 MEQ PO TBCR
20.0000 meq | EXTENDED_RELEASE_TABLET | Freq: Two times a day (BID) | ORAL | Status: DC
  Administered 2023-06-29 – 2023-06-30 (×3): 20 meq via ORAL
  Filled 2023-06-28 (×3): qty 1

## 2023-06-28 MED ORDER — FUROSEMIDE 20 MG PO TABS
20.0000 mg | ORAL_TABLET | Freq: Every day | ORAL | Status: DC
  Administered 2023-06-29 – 2023-06-30 (×2): 20 mg via ORAL
  Filled 2023-06-28 (×2): qty 1

## 2023-06-28 MED ORDER — ONDANSETRON HCL 4 MG/2ML IJ SOLN: 4.0000 mg | Freq: Four times a day (QID) | INTRAMUSCULAR | Status: DC | PRN

## 2023-06-28 MED ORDER — OXYTOCIN-SODIUM CHLORIDE 30-0.9 UT/500ML-% IV SOLN
1.0000 m[IU]/min | INTRAVENOUS | Status: DC
  Administered 2023-06-28: 2 m[IU]/min via INTRAVENOUS
  Filled 2023-06-28: qty 500

## 2023-06-28 MED ORDER — FENTANYL-BUPIVACAINE-NACL 0.5-0.125-0.9 MG/250ML-% EP SOLN
12.0000 mL/h | EPIDURAL | Status: DC | PRN
  Administered 2023-06-28: 12 mL/h via EPIDURAL
  Filled 2023-06-28: qty 250

## 2023-06-28 MED ORDER — ONDANSETRON HCL 4 MG PO TABS: 4.0000 mg | ORAL_TABLET | ORAL | Status: DC | PRN

## 2023-06-28 MED ORDER — COCONUT OIL OIL: 1.0000 | TOPICAL_OIL | Status: DC | PRN

## 2023-06-28 MED ORDER — OXYTOCIN BOLUS FROM INFUSION
333.0000 mL | Freq: Once | INTRAVENOUS | Status: AC
  Administered 2023-06-28: 333 mL via INTRAVENOUS

## 2023-06-28 MED ORDER — SODIUM CHLORIDE 0.9% FLUSH: 3.0000 mL | Freq: Two times a day (BID) | INTRAVENOUS | Status: DC

## 2023-06-28 MED ORDER — DIPHENHYDRAMINE HCL 50 MG/ML IJ SOLN: 12.5000 mg | INTRAMUSCULAR | Status: DC | PRN

## 2023-06-28 MED ORDER — TERBUTALINE SULFATE 1 MG/ML IJ SOLN: 0.2500 mg | Freq: Once | INTRAMUSCULAR | Status: DC | PRN

## 2023-06-28 MED ORDER — WITCH HAZEL-GLYCERIN EX PADS: 1.0000 | MEDICATED_PAD | CUTANEOUS | Status: DC | PRN

## 2023-06-28 NOTE — Lactation Note (Signed)
 This note was copied from a baby's chart. Lactation Consultation Note  Patient Name: Wanda Erickson [id number] Date: 06/28/2023 Age:29 hours Reason for consult: L&D Initial assessment;Early term 37-38.6wks Baby has been grunting and under warmer. RN brought baby to mom. LC got baby latched. No respiratory distress noted at this time. Baby slightly fussy but latched well and stopped fussing while latched. He would suckle some then unlatch and fuss. Got mom to talk to baby and his bright eyes looked up at mom and he would suckle well. Baby BF well for about 10 minutes then started grunting again. Placed baby between mom's breast STS and covered w/2 blankets. Baby quiet down and resting well when left. Mom didn't BF her first 2 children. Will f/u w/mom on MBU.  Maternal Data    Feeding    LATCH Score Latch: Repeated attempts needed to sustain latch, nipple held in mouth throughout feeding, stimulation needed to elicit sucking reflex.  Audible Swallowing: None  Type of Nipple: Everted at rest and after stimulation  Comfort (Breast/Nipple): Soft / non-tender  Hold (Positioning): Assistance needed to correctly position infant at breast and maintain latch.  LATCH Score: 6   Lactation Tools Discussed/Used    Interventions Interventions: Assisted with latch;Skin to skin;Breast compression;Support pillows;Position options  Discharge    Consult Status Consult Status: Follow-up from L&D Date: 06/29/23 Follow-up type: In-patient    Charyl Dancer 06/28/2023, 8:51 PM

## 2023-06-28 NOTE — Progress Notes (Signed)
   PRENATAL VISIT NOTE  Subjective:  Wanda Erickson is a 28 y.o. [phone] at [redacted]w[redacted]d being seen today for ongoing prenatal care.  She is currently monitored for the following issues for this high-risk pregnancy and has Uterine hemorrhage; Supervision of other high risk pregnancy, antepartum; Obesity in pregnancy, antepartum; History of anxiety disorder; History of HELLP syndrome, currently pregnant; Elevated hemoglobin A1c; GDM (gestational diabetes mellitus); and Anemia during pregnancy in third trimester on their problem list.  Patient reports occasional contractions.  Contractions: Irregular. Vag. Bleeding: None.  Movement: Present. Denies leaking of fluid.   The following portions of the patient's history were reviewed and updated as appropriate: allergies, current medications, past family history, past medical history, past social history, past surgical history and problem list.   Objective:   Vitals:   06/28/23 0836  BP: (!) 140/90  Pulse: (!) 110  Weight: 214 lb (97.1 kg)    Fetal Status: Fetal Heart Rate (bpm): 146   Movement: Present  Presentation: Vertex  General:  Alert, oriented and cooperative. Patient is in no acute distress.  Skin: Skin is warm and dry. No rash noted.   Cardiovascular: Normal heart rate noted  Respiratory: Normal respiratory effort, no problems with respiration noted  Abdomen: Soft, gravid, appropriate for gestational age.  Pain/Pressure: Present     Pelvic: Cervical exam performed in the presence of a chaperone Dilation: 5 Effacement (%): 50 Station: -3  Extremities: Normal range of motion.  Edema: Trace  Mental Status: Normal mood and affect. Normal behavior. Normal judgment and thought content.   Assessment and Plan:  Pregnancy: [id number] at [redacted]w[redacted]d 1. Supervision of other high risk pregnancy, antepartum (Primary) HTN  2. Gestational diabetes mellitus (GDM) in third trimester, gestational diabetes method of control unspecified Diet controlled  3.  History of HELLP syndrome, currently pregnant BP elevated  Term labor symptoms and general obstetric precautions including but not limited to vaginal bleeding, contractions, leaking of fluid and fetal movement were reviewed in detail with the patient. Please refer to After Visit Summary for other counseling recommendations.  To L&D direct admit Return if symptoms worsen or fail to improve.  Future Appointments  Date Time Provider Department Center  07/07/2023  8:15 AM Adam Phenix, MD [facility] None  07/30/2023 10:45 AM [facility] CLINICIAN [facility]  08/18/2023  8:35 AM Dunn, Gillian Scarce, MD [facility] None    Scheryl Darter, MD

## 2023-06-28 NOTE — Progress Notes (Signed)
 Labor Progress Note Wanda Erickson is a 28 y.o. [id number] at [redacted]w[redacted]d here for IOL for gHTN.  S: Patient reports she is feeling great and says, "I'm in heaven." Clarifies she is not being sarcastic. Epidural working well.  O:  BP 110/69   Pulse 89   Temp 98 F (36.7 C) (Oral)   Resp 18   Ht 5\' 3"  (1.6 m)   Wt 97.3 kg   LMP 10/08/2022 (Exact Date)   BMI 38.00 kg/m  EFM: baseline 130bpm, moderate variability, +accels, 1 variable decel at 15:24.  CVE: Dilation: 6 Effacement (%): 70 Station: -3 Presentation: Vertex Exam by:: rebecca [email or web address],rnc-ob   A&P:  Wanda Erickson is a 28 y.o. [id number] at [redacted]w[redacted]d here for IOL for gHTN.   --IOL: Most recent SVE 6/70/-3 at 13:05. Epidural in place. Pitocin started at 11:12, currently at 36mL/hr. Plan to continue with pitocin. --FWB: Category II due to 1 variable decel at 15:24 but overall remains reassuring. Continue to monitor. --gHTN: No home meds. Asymptomatic, BP labs WNL on admit. Intermittent MRBPs. Continue to monitor. --gDMA1: Diet-controlled. Plan for CBGs q4hr in latent labor and q2hr in active labor. --Hx pre-E and HELLP in G2  --Rh pos/M OBH/GBS neg/breast/OCP bridge to vasectomy/no circ  Dispo: Admitted to L+D for intrapartum care. Anticipate SVD.   Thereasa Solo, MD 4:45 PM

## 2023-06-28 NOTE — Discharge Summary (Shared)
 Postpartum Discharge Summary  Date of Service updated***     Patient Name: Wanda Erickson DOB: [birth year] MRN: [id number]  Date of admission: 06/28/2023 Delivery date:06/28/2023 Delivering provider: Thereasa Solo Date of discharge: 06/28/2023  Admitting diagnosis: Gestational hypertension [O13.9] Intrauterine pregnancy: [redacted]w[redacted]d     Secondary diagnosis:  Principal Problem:   SVD (spontaneous vaginal delivery) Active Problems:   Gestational hypertension  Additional problems: ***    Discharge diagnosis: Term Pregnancy Delivered and Gestational Hypertension                                              Post partum procedures:{Postpartum procedures:23558} Augmentation: AROM and Pitocin Complications: {OB Labor/Delivery Complications:20784}  Hospital course: Induction of Labor With Vaginal Delivery   28 y.o. yo [id number] at [redacted]w[redacted]d was admitted to the hospital 06/28/2023 for induction of labor.  Indication for induction:  gestational HTN .  Patient had an labor course complicated by nothing. Membrane Rupture Time/Date: 5:36 PM,06/28/2023  Delivery Method:Vaginal, Spontaneous Operative Delivery:N/A Episiotomy:   Lacerations:  1st degree;Vaginal;Perineal Details of delivery can be found in separate delivery note.  Patient had a postpartum course complicated by***. Patient is discharged home 06/28/23.  Newborn Data: Birth date:06/28/2023 Birth time:7:47 PM Gender:Female Living status:Living Apgars:9 ,9  Weight:   Magnesium Sulfate received: {Mag received:30440022} BMZ received: No Rhophylac:N/A MMR:N/A T-DaP:Given prenatally Flu: Yes RSV Vaccine received: No Transfusion:{Transfusion received:30440034}  Immunizations received: Immunization History  Administered Date(s) Administered   Tdap 04/20/2023    Physical exam  Vitals:   06/28/23 1800 06/28/23 1830 06/28/23 1905 06/28/23 1921  BP: (!) 114/99 (!) 133/96 (!) 150/84 (!) 140/85  Pulse: 99 (!) 105 (!) 114 (!)  119  Resp: 18 20 18 18   Temp:  98.1 F (36.7 C)  98.2 F (36.8 C)  TempSrc:  Oral  Oral  Weight:      Height:       General: {Exam; general:21111117} Lochia: {Desc; appropriate/inappropriate:30686::"appropriate"} Uterine Fundus: {Desc; firm/soft:30687} Incision: {Exam; incision:21111123} DVT Evaluation: {Exam; MWU:1324401} Labs: Lab Results  Component Value Date   WBC 12.1 (H) 06/28/2023   HGB 9.3 (L) 06/28/2023   HCT 30.7 (L) 06/28/2023   MCV 75.2 (L) 06/28/2023   PLT 329 06/28/2023      Latest Ref Rng & Units 06/28/2023   10:37 AM  CMP  Glucose 70 - 99 mg/dL 027   BUN 6 - 20 mg/dL 8   Creatinine 2.53 - 6.64 mg/dL 4.03   Sodium 474 - 259 mmol/L 137   Potassium 3.5 - 5.1 mmol/L 3.9   Chloride 98 - 111 mmol/L 110   CO2 22 - 32 mmol/L 18   Calcium 8.9 - 10.3 mg/dL 8.6   Total Protein 6.5 - 8.1 g/dL 6.1   Total Bilirubin 0.0 - 1.2 mg/dL 0.4   Alkaline Phos 38 - 126 U/L 92   AST 15 - 41 U/L 18   ALT 0 - 44 U/L 12    Edinburgh Score:     No data to display         No data recorded  After visit meds:  Allergies as of 06/28/2023       Reactions   Septra [sulfamethoxazole-trimethoprim]      Med Rec must be completed prior to using this [facility]***        Discharge home in stable condition Infant  Feeding: {Baby feeding:23562} Infant Disposition:{CHL IP OB HOME WITH ZOXWRU:04540} Discharge instruction: per After Visit Summary and Postpartum booklet. Activity: Advance as tolerated. Pelvic rest for 6 weeks.  Diet: {OB JWJX:91478295} Future Appointments: Future Appointments  Date Time Provider Department Center  07/05/2023 11:30 AM [facility] NURSE [facility] None  07/30/2023 10:45 AM [facility]  08/18/2023  8:35 AM Dunn, Gillian Scarce, MD [facility] None   Follow up Visit: Message sent to [facility] 3/24  Please schedule this patient for a In person postpartum visit in 6 weeks with the following provider: Any provider. Additional  Postpartum F/U:BP check 1 week  High risk pregnancy complicated by: HTN Delivery mode:  Vaginal, Spontaneous Anticipated Birth Control:  POPs   06/28/2023 Sundra Aland, MD

## 2023-06-28 NOTE — Anesthesia Preprocedure Evaluation (Signed)
 Anesthesia Evaluation  Patient identified by MRN, date of birth, ID band Patient awake    Reviewed: Allergy & Precautions, Patient's Chart, lab work & pertinent test results  Airway Mallampati: II  TM Distance: >3 FB Neck ROM: Full    Dental  (+) Dental Advisory Given, Chipped,    Pulmonary asthma    Pulmonary exam normal breath sounds clear to auscultation       Cardiovascular hypertension, Normal cardiovascular exam Rhythm:Regular Rate:Normal     Neuro/Psych negative neurological ROS  negative psych ROS   GI/Hepatic negative GI ROS, Neg liver ROS,,,  Endo/Other  diabetes    Renal/GU negative Renal ROS     Musculoskeletal negative musculoskeletal ROS (+)    Abdominal  (+) + obese  Peds  Hematology  (+) Blood dyscrasia, anemia   Anesthesia Other Findings   Reproductive/Obstetrics (+) Pregnancy                             Anesthesia Physical Anesthesia Plan  ASA: 2  Anesthesia Plan: Epidural   Post-op Pain Management:    Induction:   PONV Risk Score and Plan:   Airway Management Planned:   Additional Equipment:   Intra-op Plan:   Post-operative Plan:   Informed Consent: I have reviewed the patients History and Physical, chart, labs and discussed the procedure including the risks, benefits and alternatives for the proposed anesthesia with the patient or authorized representative who has indicated his/her understanding and acceptance.     Dental advisory given  Plan Discussed with: CRNA  Anesthesia Plan Comments:         Anesthesia Quick Evaluation

## 2023-06-28 NOTE — Anesthesia Procedure Notes (Signed)
 Epidural Patient location during procedure: OB Start time: 06/28/2023 12:22 PM End time: 06/28/2023 12:41 PM  Staffing Anesthesiologist: Lewie Loron, MD Performed: anesthesiologist   Preanesthetic Checklist Completed: patient identified, IV checked, risks and benefits discussed, monitors and equipment checked, pre-op evaluation and timeout performed  Epidural Patient position: sitting Prep: DuraPrep and site prepped and draped Patient monitoring: heart rate, continuous pulse ox and blood pressure Approach: midline Location: L3-L4 Injection technique: LOR air and LOR saline  Needle:  Needle type: Tuohy  Needle gauge: 17 G Needle length: 9 cm Needle insertion depth: 7 cm Catheter type: closed end flexible Catheter size: 19 Gauge Catheter at skin depth: 13 cm Test dose: negative and 1.5% lidocaine with Epi 1:200 K  Assessment Sensory level: T8 Events: blood not aspirated, no cerebrospinal fluid, injection not painful, no injection resistance, no paresthesia and negative IV test  Additional Notes Reason for block:procedure for pain

## 2023-06-28 NOTE — H&P (Cosign Needed Addendum)
 OBSTETRIC ADMISSION HISTORY AND PHYSICAL  Wanda Erickson is a 28 y.o. female [phone] with IUP at [redacted]w[redacted]d by LMP presenting for IOL for gHTN.   Denies LOF, consistent CTX, VB, abnormal VD, or decreased FM. Also denies dizziness, vision changes, headaches, CP, vomiting, or RUQ pain. Endorses baseline SOB and mild BLE edema as well as now-resolved nausea this AM. She plans on breast feeding. She request OCP bridge to vasectomy for birth control. She received her prenatal care at [facility]   Dating: By LMP --->  Estimated Date of Delivery: 07/15/23  Sono:    @[redacted]w[redacted]d , CWD, normal anatomy, cephalic presentation, 3069g, 45% EFW   Prenatal History/Complications:  G1 - 2019, spontaneous AB G2 - 2020, SVD at [redacted]w[redacted]d, c/b pre-E and HELLP syndrome G3 - 2023, SVD at [redacted]w[redacted]d, no complications G4 - current, c/b gHTN and gDMA1  Past Medical History: Past Medical History:  Diagnosis Date   Anemia due to acute blood loss 11/23/2017   Appendicitis 11/13/2021   Appendicitis, acute 11/13/2021   Asthma    "As a kid"   Diabetes mellitus without complication (HCC)    Incomplete abortion 11/23/2017   Incomplete abortion with delayed or excessive hemorrhage 11/23/2017   Miscarriage    No known health problems    Tachycardia     Past Surgical History: Past Surgical History:  Procedure Laterality Date   APPENDECTOMY     DILATION AND EVACUATION N/A 11/23/2017   Procedure: DILATATION AND EVACUATION;  Surgeon: Conard Novak, MD;  Location: [facility] ORS;  Service: Gynecology;  Laterality: N/A;   LAPAROSCOPIC APPENDECTOMY N/A 11/13/2021   Procedure: APPENDECTOMY LAPAROSCOPIC;  Surgeon: Franky Macho, MD;  Location: [facility];  Service: General;  Laterality: N/A;    Obstetrical History: OB History     Gravida  4   Para  2   Term  1   Preterm  1   AB  1   Living  2      SAB  1   IAB      Ectopic      Multiple  1   Live Births  2           Social History Social History    Socioeconomic History   Marital status: Married    Spouse name: Not on file   Number of children: Not on file   Years of education: Not on file   Highest education level: Not on file  Occupational History   Not on file  Tobacco Use   Smoking status: Never   Smokeless tobacco: Never  Vaping Use   Vaping status: Never Used  Substance and Sexual Activity   Alcohol use: Not Currently   Drug use: Never   Sexual activity: Not Currently    Birth control/protection: None  Other Topics Concern   Not on file  Social History Narrative   Not on file   Social Drivers of Health   Financial Resource Strain: Not on file  Food Insecurity: No Food Insecurity (06/28/2023)   Hunger Vital Sign    Worried About Running Out of Food in the Last Year: Never true    Ran Out of Food in the Last Year: Never true  Transportation Needs: No Transportation Needs (06/28/2023)   PRAPARE - Administrator, Civil Service (Medical): No    Lack of Transportation (Non-Medical): No  Physical Activity: Not on file  Stress: Not on file  Social Connections: Not on file    Family History:  Family History  Problem Relation Age of Onset   Drug abuse Father    Alcohol abuse Father    Schizophrenia Father    Heart disease Sister    Graves' disease Sister    Asthma Neg Hx    Cancer Neg Hx    Diabetes Neg Hx    Hypertension Neg Hx     Allergies: Allergies  Allergen Reactions   Septra [Sulfamethoxazole-Trimethoprim]     Medications Prior to Admission  Medication Sig Dispense Refill Last Dose/Taking   aspirin 81 MG chewable tablet Chew by mouth daily.   06/27/2023   Prenatal Vit-Fe Fumarate-FA (PRENATAL MULTIVITAMIN) TABS tablet Take 1 tablet by mouth daily at 12 noon.   06/27/2023   ACCU-CHEK GUIDE TEST test strip USE 1 STRIP TO CHECK GLUCOSE AS DIRECTED IN THE MORNING, AT NOON AND AT BEDTIME 100 each 0    Blood Glucose Monitoring Suppl DEVI 1 each by Does not apply route 4 (four) times daily.  May substitute to any manufacturer covered by patient's insurance. 1 each 0    ferrous sulfate 325 (65 FE) MG tablet Take 1 tablet (325 mg total) by mouth every other day. 15 tablet 0      Review of Systems   All systems reviewed and negative except as stated in HPI  Blood pressure 137/78, pulse (!) 139, resp. rate 18, height 5\' 3"  (1.6 m), weight 97.3 kg, last menstrual period 10/08/2022. General appearance: alert, cooperative, and no distress Lungs: clear to auscultation bilaterally, no increased work of breathing Heart: regular rhythm, tachycardic, no murmurs/rubs/gallops, peripheral pulses 2+, normal peripheral perfusion Abdomen: soft, mildly tender at RUQ, gravid, normoactive bowel sounds Extremities: nontender, no deformity, no signs of DVT Presentation: cephalic Fetal monitoringBaseline: 145 bpm, Variability: Good {> 6 bpm), Accelerations: Reactive, and Decelerations: Absent Uterine activityNone     Prenatal labs: ABO, Rh: --/--/PENDING (03/24 1044) Antibody: PENDING (03/24 1044) Rubella:   RPR: Non Reactive (01/14 0940)  HBsAg:    HIV: Non Reactive (01/14 0940)  GBS: Negative/-- (03/11 1044)    Lab Results  Component Value Date   GBS Negative 06/15/2023   GTT positive Genetic screening  Insufficient fetal DNA, declined redraw Anatomy US 02/26/2023  Immunization History  Administered Date(s) Administered   Tdap 04/20/2023    Prenatal Transfer Tool  Maternal Diabetes: Yes:  Diabetes Type:  Diet controlled Genetic Screening: Declined Maternal Ultrasounds/Referrals: Normal Fetal Ultrasounds or other Referrals:  None Maternal Substance Abuse:  No Significant Maternal Medications:  None Significant Maternal Lab Results: None Number of Prenatal Visits:greater than 3 verified prenatal visits Maternal Vaccinations:TDap Other Comments:  None   Results for orders placed or performed during the hospital encounter of 06/28/23 (from the past 24 hours)  Type and  screen [facility]   Collection Time: 06/28/23 10:44 AM  Result Value Ref Range   ABO/RH(D) PENDING    Antibody Screen PENDING    Sample Expiration      07/01/2023,2359 Performed at [facility], [address] [place], [place] [address]     Patient Active Problem List   Diagnosis Date Noted   Gestational hypertension 06/28/2023   Anemia during pregnancy in third trimester 04/23/2023   GDM (gestational diabetes mellitus) 02/15/2023   Obesity in pregnancy, antepartum 01/26/2023   History of anxiety disorder 01/26/2023   History of HELLP syndrome, currently pregnant 01/26/2023   Elevated hemoglobin A1c 01/26/2023   Supervision of other high risk pregnancy, antepartum 01/19/2023   Uterine hemorrhage  11/23/2017    Assessment/Plan:  Wanda Erickson is a 28 y.o. [id number] at [redacted]w[redacted]d here for IOL for gHTN.  IOL: SVE 5/50/-3 on presentation. Plan for pitocin. Pain: Epidural FWB: Category I, reassuring. VTX by BSUS. Continue to monitor. GBS status:  negative Feeding: Breastmilk  Reproductive Life planning:  OCP bridge to vasectomy Circ:  no  gHTN: No home meds. Asymptomatic, BP labs WNL. Intermittent MRBPs. Continue to monitor. gDMA1: Diet-controlled. Plan for CBGs q4hr in latent labor and q2hr in active labor. Hx pre-E and HELLP in G2  Thereasa Solo, MD  06/28/2023, 11:13 AM

## 2023-06-28 NOTE — Progress Notes (Addendum)
 Labor Progress Note  27yo 07/1110 at [redacted]w[redacted]d a/f IOL for gHTN  In to see patient. SCE 5/50/-3 AROM for clear FHR 130bpm, moderate, +accels, no decels Pit @ 11ml/h Normotensive  Continue IOL  Harvie Bridge, MD Obstetrician & Gynecologist, [facility] for [employer], [facility]

## 2023-06-29 ENCOUNTER — Other Ambulatory Visit (HOSPITAL_COMMUNITY): Payer: Self-pay

## 2023-06-29 MED ORDER — NIFEDIPINE ER 30 MG PO TB24
30.0000 mg | ORAL_TABLET | Freq: Every day | ORAL | 0 refills | Status: DC
  Filled 2023-06-29: qty 30, 30d supply, fill #0

## 2023-06-29 MED ORDER — SENNOSIDES-DOCUSATE SODIUM 8.6-50 MG PO TABS
2.0000 | ORAL_TABLET | Freq: Every evening | ORAL | 0 refills | Status: AC | PRN
  Filled 2023-06-29: qty 30, 15d supply, fill #0

## 2023-06-29 MED ORDER — FUROSEMIDE 20 MG PO TABS
20.0000 mg | ORAL_TABLET | Freq: Every day | ORAL | 0 refills | Status: AC
  Filled 2023-06-29: qty 5, 5d supply, fill #0

## 2023-06-29 MED ORDER — IBUPROFEN 800 MG PO TABS
800.0000 mg | ORAL_TABLET | Freq: Three times a day (TID) | ORAL | 0 refills | Status: AC
  Filled 2023-06-29: qty 30, 10d supply, fill #0

## 2023-06-29 MED ORDER — POTASSIUM CHLORIDE CRYS ER 20 MEQ PO TBCR
20.0000 meq | EXTENDED_RELEASE_TABLET | Freq: Every day | ORAL | 0 refills | Status: AC
  Filled 2023-06-29: qty 5, 5d supply, fill #0

## 2023-06-29 MED ORDER — ACETAMINOPHEN 325 MG PO TABS
650.0000 mg | ORAL_TABLET | ORAL | 0 refills | Status: AC | PRN
  Filled 2023-06-29: qty 60, 5d supply, fill #0

## 2023-06-29 MED ORDER — FERROUS SULFATE 325 (65 FE) MG PO TABS
325.0000 mg | ORAL_TABLET | ORAL | Status: DC
  Administered 2023-06-29: 325 mg via ORAL
  Filled 2023-06-29: qty 1

## 2023-06-29 NOTE — Lactation Note (Signed)
 This note was copied from a baby's chart. Lactation Consultation Note  Patient Name: Wanda Erickson [id number] Date: 06/29/2023 Age:29 hours  Attempted to see mom but she was sleeping.   Maternal Data    Feeding    LATCH Score                    Lactation Tools Discussed/Used    Interventions    Discharge    Consult Status      Katricia Prehn, Diamond Nickel 06/29/2023, 3:06 AM

## 2023-06-29 NOTE — Anesthesia Postprocedure Evaluation (Signed)
 Anesthesia Post Note  Patient: Wanda Erickson  Procedure(s) Performed: AN AD HOC LABOR EPIDURAL     Patient location during evaluation: Mother Baby Anesthesia Type: Epidural Level of consciousness: awake and alert and oriented Pain management: satisfactory to patient Vital Signs Assessment: post-procedure vital signs reviewed and stable Respiratory status: respiratory function stable Cardiovascular status: stable Postop Assessment: no headache, no backache, epidural receding, patient able to bend at knees, no signs of nausea or vomiting and adequate PO intake Anesthetic complications: no   No notable events documented.  Last Vitals:  Vitals:   06/29/23 0300 06/29/23 0602  BP: 122/65 125/81  Pulse: (!) 108 97  Resp: 18 18  Temp:  36.7 C  SpO2: 99% 100%    Last Pain:  Vitals:   06/29/23 0605  TempSrc:   PainSc: 3    Pain Goal:                   Karleen Dolphin

## 2023-06-29 NOTE — Progress Notes (Signed)
 Post Partum Day 1 s/p SVD Subjective: up ad lib, voiding, and tolerating PO. Notes some cramping pain that improves with tylenol/ibuprofen  Objective: Blood pressure 125/81, pulse 97, temperature 98 F (36.7 C), temperature source Oral, resp. rate 18, height 5\' 3"  (1.6 m), weight 97.3 kg, last menstrual period 10/08/2022, SpO2 100%, unknown if currently breastfeeding.  Physical Exam:  General: alert, cooperative, and no distress Lochia: appropriate Uterine Fundus: firm DVT Evaluation: No evidence of DVT seen on physical exam.  Recent Labs    06/28/23 1037 06/28/23 2056  HGB 9.3* 8.9*  HCT 30.7* 29.7*    Assessment/Plan: Postpartum - Contraception: POP as bridge to vasectomy - MOF: breast - Rh status: Rh+ - Rubella status: Immune - Dispo: anticipate discharge home PPD2. OK for discharge home PPD1 if baby is cleared for discharge  Neonatal - Doing well at bedside - Circumcision: declined  3. gHTN - normotensive since delivery - continue procardia XL 30mg  daily, lasix & PO K  4. Combined acute blood loss anemia & iron deficiency anemia - Due to expected blood loss - PO iron   LOS: 1 day   Lennart Pall 06/29/2023, 7:35 AM

## 2023-06-29 NOTE — Lactation Note (Addendum)
 This note was copied from a baby's chart. Lactation Consultation Note  Patient Name: Wanda Erickson [id number] Date: 06/29/2023 Age:29 hours Reason for consult: Initial assessment;1st time breastfeeding;Early term 37-38.6wks  P3, Baby [redacted]w[redacted]d.  Mother would like to breastfeed and supplement with her milk and formula.  Mother brought personal formula from home Enfamil Gentlease.  Baby consumed 10 ml of formula after recent 10 min breastfeeding session because mother felt baby was still hungry.  Encouraged mother to offer second breast if baby is still hungry.  Feed on demand with cues.  Goal 8-12+ times per day after first 24 hrs.  Place baby STS if not cueing.   Mother stated she was able to pump 2 oz. breastmilk at home.  Set up mother with DEBP with 21 mm flanges.  She will pump PRN to stimulate her supply if she continues to supplement with formula.  Reviewed volume guidelines for supplementation and breastmilk storage.    Maternal Data Has patient been taught Hand Expression?: Yes Does the patient have breastfeeding experience prior to this delivery?: No  Feeding Mother's Current Feeding Choice: Breast Milk and Formula  Lactation Tools Discussed/Used Tools: Pump;Flanges Flange Size: 21 Breast pump type: Double-Electric Breast Pump;Manual Pump Education: Setup, frequency, and cleaning;Milk Storage Reason for Pumping: mother's choice Pumping frequency: PRN 15 min  Interventions Interventions: Breast feeding basics reviewed;DEBP;Education  Discharge Pump: Personal  Consult Status Consult Status: Follow-up Date: 06/30/23 Follow-up type: In-patient   Dahlia Byes Boschen  RN, IBCLC 06/29/2023, 10:22 AM

## 2023-06-29 NOTE — Lactation Note (Signed)
 This note was copied from a baby's chart. Lactation Consultation Note  Patient Name: Wanda Erickson ZOXWR'U Date: 06/29/2023 Age:29 hours Reason for consult: Mother's request;Difficult latch;Early term 37-38.6wks LC was unable to assist with latch due to infant receiving 30 mls of 20 kcal formula prior to [facility] entering the room. MOB will call [facility] services for assistance with next latch. LC reviewed how to use DEBP, MOB understands to pump every 3 hours for 15 minutes to help stimulate and establish her milk supply as she works towards latching infant at the breast. MOB was using DEBP and still expressing colostrum in breast flanges as LC left the room. MOB knows that her EBM is safe for 4 hours whereas formula once open is only safe for 1 hour. MOB will continue to feed infant by cues, on demand, every 2-3 hours, skin to skin.   Maternal Data    Feeding Mother's Current Feeding Choice: Breast Milk  LATCH Score                    Lactation Tools Discussed/Used Tools: Pump;Flanges Flange Size: 21 Breast pump type: Double-Electric Breast Pump Pump Education: Setup, frequency, and cleaning Reason for Pumping: Pumping due to latch difficulties, infant is ETI and MOB informed LC she had latchdifficulties with previous children Pumping frequency: MOB will continue to use DEBP every 3 hours for 15 minutes on initial setting. Pumped volume: 4 mL (MOB expressed 2 mls in each flange and was still pumping when lC lef tthe room.)  Interventions Interventions: Skin to skin;Hand express;Expressed milk;Education;CDC milk storage guidelines;CDC Guidelines for Breast Pump Cleaning  Discharge Pump: DEBP;Personal  Consult Status Consult Status: Follow-up Date: 06/30/23 Follow-up type: In-patient    Frederico Hamman 06/29/2023, 6:21 PM

## 2023-06-29 NOTE — Patient Instructions (Signed)
 Your appointment with Outpatient Lactation is: July 09, 2023 3:30 pm [facility] (First Floor) [address] [place]  Check in under baby's name.  Please bring your baby hungry along with your pump and a bottle of either formula or expressed breast milk. Please also bring your pump flanges and we welcome support people! If you need lactation assistance before your appointment, please call [phone] and press 4 for lactation.

## 2023-06-30 ENCOUNTER — Encounter (HOSPITAL_COMMUNITY): Payer: Self-pay | Admitting: Family Medicine

## 2023-06-30 ENCOUNTER — Other Ambulatory Visit (HOSPITAL_COMMUNITY): Payer: Self-pay

## 2023-06-30 LAB — SURGICAL PATHOLOGY

## 2023-06-30 MED ORDER — NIFEDIPINE ER OSMOTIC RELEASE 30 MG PO TB24
60.0000 mg | ORAL_TABLET | Freq: Every day | ORAL | Status: DC
  Administered 2023-06-30: 60 mg via ORAL
  Filled 2023-06-30: qty 2

## 2023-06-30 MED ORDER — NORGESTIMATE-ETH ESTRADIOL 0.25-35 MG-MCG PO TABS
1.0000 | ORAL_TABLET | Freq: Every day | ORAL | 11 refills | Status: AC
  Filled 2023-06-30: qty 28, 28d supply, fill #0

## 2023-06-30 MED ORDER — NIFEDIPINE ER 60 MG PO TB24
60.0000 mg | ORAL_TABLET | Freq: Every day | ORAL | 11 refills | Status: AC
  Filled 2023-06-30: qty 30, 30d supply, fill #0

## 2023-06-30 NOTE — Progress Notes (Signed)
 CSW received consult for hx of Anxiety; and MOB met at bedside to offer support and complete assessment. CSW entered the room, introduced herself and explained the reason for the visit. CSW observed MOB patiently waiting on the bed as the infant completed his car seat check.   CSW inquired about MOB's mental health history. MOB reported experiencing anxiety due to her being a "anxious person". MOB reported currently participating in therapy with Hulda Marin at [facility]; she has a follow up appointment April 2nd and will continue therapy during this PP period for support. MOB reported her supports as FOB, her family and FOB's family. CSW provided education regarding the baby blues period vs. perinatal mood disorders, discussed treatment and gave resources for mental health follow up if concerns arise.  CSW recommends self-evaluation during the postpartum time period using the New Mom Checklist from Postpartum Progress and encouraged MOB to contact a medical professional if symptoms are noted at any time.  CSW assessed for safety with MOB SI/HI/DV;MOB denied all.  CSW asked MOB has she selected a pediatrician for the infant's follow up visits; MOB said [facility]. MOB reported having all essential items for the infant including a carseat, bassinet and crib for safe sleeping. CSW provided review of Sudden Infant Death Syndrome (SIDS) precautions.    CSW identifies no further need for intervention and no barriers to discharge at this time.  Enos Fling, Theresia Majors Clinical Social Worker [phone]

## 2023-07-04 ENCOUNTER — Other Ambulatory Visit: Payer: Self-pay

## 2023-07-04 ENCOUNTER — Emergency Department (HOSPITAL_COMMUNITY)

## 2023-07-04 ENCOUNTER — Emergency Department (HOSPITAL_COMMUNITY)
Admission: EM | Admit: 2023-07-04 | Discharge: 2023-07-04 | Disposition: A | Discharge status: Home / Self Care | Attending: Emergency Medicine | Admitting: Emergency Medicine

## 2023-07-04 DIAGNOSIS — O99893 Other specified diseases and conditions complicating puerperium: Secondary | ICD-10-CM | POA: Diagnosis present

## 2023-07-04 DIAGNOSIS — R000 Tachycardia, unspecified: Secondary | ICD-10-CM | POA: Diagnosis not present

## 2023-07-04 DIAGNOSIS — R42 Dizziness and giddiness: Secondary | ICD-10-CM | POA: Diagnosis not present

## 2023-07-04 LAB — CBC WITH DIFFERENTIAL/PLATELET
Abs Immature Granulocytes: 0.08 10*3/uL — ABNORMAL HIGH (ref 0.00–0.07)
Basophils Absolute: 0 10*3/uL (ref 0.0–0.1)
Basophils Relative: 0 %
Eosinophils Absolute: 0.2 10*3/uL (ref 0.0–0.5)
Eosinophils Relative: 1 %
HCT: 33.8 % — ABNORMAL LOW (ref 36.0–46.0)
Hemoglobin: 10.1 g/dL — ABNORMAL LOW (ref 12.0–15.0)
Immature Granulocytes: 1 %
Lymphocytes Relative: 18 %
Lymphs Abs: 2.7 10*3/uL (ref 0.7–4.0)
MCH: 22.7 pg — ABNORMAL LOW (ref 26.0–34.0)
MCHC: 29.9 g/dL — ABNORMAL LOW (ref 30.0–36.0)
MCV: 76.1 fL — ABNORMAL LOW (ref 80.0–100.0)
Monocytes Absolute: 0.6 10*3/uL (ref 0.1–1.0)
Monocytes Relative: 4 %
Neutro Abs: 11.1 10*3/uL — ABNORMAL HIGH (ref 1.7–7.7)
Neutrophils Relative %: 76 %
Platelets: 376 10*3/uL (ref 150–400)
RBC: 4.44 MIL/uL (ref 3.87–5.11)
RDW: 18.3 % — ABNORMAL HIGH (ref 11.5–15.5)
WBC: 14.7 10*3/uL — ABNORMAL HIGH (ref 4.0–10.5)
nRBC: 0 % (ref 0.0–0.2)

## 2023-07-04 LAB — COMPREHENSIVE METABOLIC PANEL WITH GFR
ALT: 26 U/L (ref 0–44)
AST: 25 U/L (ref 15–41)
Albumin: 2.8 g/dL — ABNORMAL LOW (ref 3.5–5.0)
Alkaline Phosphatase: 81 U/L (ref 38–126)
Anion gap: 11 (ref 5–15)
BUN: 18 mg/dL (ref 6–20)
CO2: 18 mmol/L — ABNORMAL LOW (ref 22–32)
Calcium: 8.4 mg/dL — ABNORMAL LOW (ref 8.9–10.3)
Chloride: 106 mmol/L (ref 98–111)
Creatinine, Ser: 0.93 mg/dL (ref 0.44–1.00)
GFR, Estimated: >60 mL/min (ref >60)
Glucose, Bld: 144 mg/dL — ABNORMAL HIGH (ref 70–99)
Potassium: 3.8 mmol/L (ref 3.5–5.1)
Sodium: 135 mmol/L (ref 135–145)
Total Bilirubin: 0.3 mg/dL (ref 0.0–1.2)
Total Protein: 6.9 g/dL (ref 6.5–8.1)

## 2023-07-04 LAB — URINALYSIS, ROUTINE W REFLEX MICROSCOPIC
Bacteria, UA: NONE SEEN
Bilirubin Urine: NEGATIVE
Glucose, UA: NEGATIVE mg/dL
Ketones, ur: NEGATIVE mg/dL
Nitrite: NEGATIVE
Protein, ur: NEGATIVE mg/dL
Specific Gravity, Urine: 1.029 (ref 1.005–1.030)
pH: 6 (ref 5.0–8.0)

## 2023-07-04 LAB — TROPONIN I (HIGH SENSITIVITY): Troponin I (High Sensitivity): 2 ng/L (ref <18)

## 2023-07-04 MED ORDER — IOHEXOL 350 MG/ML SOLN
75.0000 mL | Freq: Once | INTRAVENOUS | Status: AC | PRN
  Administered 2023-07-04: 75 mL via INTRAVENOUS

## 2023-07-04 MED ORDER — LACTATED RINGERS IV BOLUS
500.0000 mL | Freq: Once | INTRAVENOUS | Status: AC
  Administered 2023-07-04: 500 mL via INTRAVENOUS

## 2023-07-04 NOTE — ED Provider Notes (Signed)
 [place] EMERGENCY DEPARTMENT AT [facility] Provider Note   CSN: [id number] Arrival date & time: 07/04/23  1252     History  Chief Complaint  Patient presents with   Tachycardia    Wanda Erickson is a 28 y.o. female.  She denies any chronic medical conditions but had a vaginal delivery 5 days ago.  She comes in via EMS complaining of dizziness, fast heart rate and shortness of breath that started today.  She thinks it could be due to her breast-feeding and taking Lasix that she was discharged with and that she got dehydrated, she is feeling better at rest but still noted to be tachycardic.  She denies any calf swelling or leg pain.  Denies chest pain, states she had shortness of breath earlier but feels better at this time.  HPI     Home Medications Prior to Admission medications   Medication Sig Start Date End Date Taking? Authorizing Provider  acetaminophen (TYLENOL) 325 MG tablet Take 2 tablets (650 mg total) by mouth every 4 (four) hours as needed (for pain scale < 4). 06/29/23  Yes Lennart Pall, MD  furosemide (LASIX) 20 MG tablet Take 1 tablet (20 mg total) by mouth daily. 06/29/23  Yes Lennart Pall, MD  ibuprofen (ADVIL) 800 MG tablet Take 1 tablet (800 mg total) by mouth every 8 (eight) hours. Patient taking differently: Take 800 mg by mouth every 8 (eight) hours as needed for moderate pain (pain score 4-6). 06/29/23  Yes Lennart Pall, MD  NIFEdipine (ADALAT CC) 60 MG 24 hr tablet Take 1 tablet (60 mg total) by mouth daily. 06/30/23 06/29/24 Yes Aviva Signs, CNM  norgestimate-ethinyl estradiol (ORTHO-CYCLEN) 0.25-35 MG-MCG tablet Take 1 tablet by mouth daily. 06/30/23  Yes Aviva Signs, CNM  potassium chloride SA (KLOR-CON M) 20 MEQ tablet Take 1 tablet (20 mEq total) by mouth daily. 06/29/23  Yes Lennart Pall, MD  senna-docusate (SENOKOT-S) 8.6-50 MG tablet Take 2 tablets by mouth at bedtime as needed for mild  constipation. Patient taking differently: Take 2 tablets by mouth daily as needed (constipation). 06/29/23  Yes Lennart Pall, MD      Allergies    Septra [sulfamethoxazole-trimethoprim]    Review of Systems   Review of Systems  Physical Exam Updated Vital Signs BP 119/78 (BP Location: Left Arm)   Pulse (!) 132   Temp 99.2 F (37.3 C) (Oral)   Resp (!) 22   Ht 5\' 3"  (1.6 m)   Wt 97.5 kg   LMP 10/08/2022 (Exact Date)   SpO2 97%   BMI 38.09 kg/m  Physical Exam Vitals and nursing note reviewed.  Constitutional:      General: She is not in acute distress.    Appearance: She is well-developed.  HENT:     Head: Normocephalic and atraumatic.     Mouth/Throat:     Mouth: Mucous membranes are moist.  Eyes:     Extraocular Movements: Extraocular movements intact.     Conjunctiva/sclera: Conjunctivae normal.     Pupils: Pupils are equal, round, and reactive to light.  Cardiovascular:     Rate and Rhythm: Regular rhythm. Tachycardia present.     Heart sounds: No murmur heard. Pulmonary:     Effort: Pulmonary effort is normal. No respiratory distress.     Breath sounds: Normal breath sounds.  Abdominal:     Palpations: Abdomen is soft.     Tenderness: There is no abdominal tenderness.  Musculoskeletal:        General: No swelling.     Cervical back: Neck supple.     Right lower leg: No edema.     Left lower leg: No edema.  Skin:    General: Skin is warm and dry.     Capillary Refill: Capillary refill takes less than 2 seconds.     Findings: No bruising or rash.  Neurological:     General: No focal deficit present.     Mental Status: She is alert and oriented to person, place, and time.  Psychiatric:        Mood and Affect: Mood normal.    ED Results / Procedures / Treatments   Labs (all labs ordered are listed, but only abnormal results are displayed) Labs Reviewed  CBC WITH DIFFERENTIAL/PLATELET  COMPREHENSIVE METABOLIC PANEL WITH GFR  URINALYSIS, ROUTINE W  REFLEX MICROSCOPIC    EKG EKG Interpretation Date/Time:  Sunday July 04 2023 12:52:56 EDT Ventricular Rate:  136 PR Interval:  131 QRS Duration:  73 QT Interval:  297 QTC Calculation: 447 R Axis:   62  Text Interpretation: Sinus tachycardia Minimal ST depression, inferior leads Confirmed by Gloris Manchester ([id number]) on 07/04/2023 1:29:09 PM  Radiology No results found.  Procedures Procedures    Medications Ordered in ED Medications  lactated ringers bolus 500 mL (has no administration in time range)    ED Course/ Medical Decision Making/ A&P                                 Medical Decision Making This patient presents to the ED for concern of dizziness, tachycardia, this involves an extensive number of treatment options, and is a complaint that carries with it a high risk of complications and morbidity.  The differential diagnosis includes PE, pneumonia, dehydration, anemia, arrhythmia, other   Co morbidities that complicate the patient evaluation :   Patient is 5 days postpartum status post SVD   Additional history obtained:  Additional history obtained from EMR External records from outside source obtained and reviewed including prior notes labs and imaging including discharge summary from recent delivery, CTA of chest from January 2025   Lab Tests:  I Ordered, and personally interpreted labs.  The pertinent results include: CBC shows improving anemia with hemoglobin at 10.1, white blood cell count 14.7, similar to several days ago, CMP shows normal renal function, normal potassium sodium and chloride, CO2 slightly decreased at 18   Imaging Studies ordered:  I ordered imaging studies including CTA chest which shows PE, no pneumonia, no acute process I independently visualized and interpreted imaging within scope of identifying emergent findings  I agree with the radiologist interpretation   Cardiac Monitoring: / EKG:  The patient was maintained on a cardiac  monitor.  I personally viewed and interpreted the cardiac monitored which showed an underlying rhythm of: Sinus tachycardia     Problem List / ED Course / Critical interventions / Medication management   Palpitations-patient received postpartum, having tachycardia, feeling dizzy at home, noted to be persistently tachycardic in the ER.  Patient feels this is due to being on Lasix and breast-feeding and thinks she got dehydrated was feeling better in the ER but was still tachycardic at 130 on the monitor.  Given small volume of fluid and discussed with patient risks and benefits of CTA to rule out PE given her recent postpartum status.  She is agreeable  with imaging.  This is negative, she is not significantly anemic does explain her symptoms.  No infectious signs or symptoms to suggest sepsis as cause of her tachycardia.  Her heart rate is improving with IV fluids.  Plan to have patient stop the Lasix as she is not having any edema, no history of heart failure, follow-up with her OB/GYN closely, increase p.o. intake.  She has an appointment scheduled tomorrow but is planning on changing until Tuesday so her husband can drive her.  She is no longer feeling dizzy.  Heart rate is down to between 107 and 115.  She states she has baseline sinus tachycardia and this is normal for her.  Given her overall reassuring workup and response to IV fluids feel comfortable patient can go home, she is given strict return precautions.  We also obtained a troponin to rule out postpartum cardiomyopathy, troponin was 2, feel this is extremely unlikely. I ordered medication including fluids for tachycardia and dizziness Reevaluation of the patient after these medicines showed that the patient improved I have reviewed the patients home medicines and have made adjustments as needed       Amount and/or Complexity of Data Reviewed Labs: ordered. Radiology: ordered.  Risk Prescription drug  management.           Final Clinical Impression(s) / ED Diagnoses Final diagnoses:  None    Rx / DC Orders ED Discharge Orders     None         Josem Kaufmann 07/04/23 1729    Gloris Manchester, MD 07/05/23 [phone]

## 2023-07-04 NOTE — ED Triage Notes (Signed)
 Pt arrived via [facility] c/o dizziness and tachycardia since today, pt is postpartum x 5 days and was discharged home with lasix and potassium to take. Denies any pain at this time. Per EMS, pts HR 143

## 2023-07-04 NOTE — Discharge Instructions (Signed)
 Pleasure taking care of you today.  Your workup was very reassuring, your heart rate has returned to normal.  You are likely dehydrated.  We did not find anything wrong with your heart, lungs, no signs of a blood clot in your lung.  No signs of infection.  Follow-up closely with your [facility] doctor as scheduled on Tuesday.  Come back to the ER if you have new or worsening symptoms.

## 2023-07-05 ENCOUNTER — Ambulatory Visit

## 2023-07-06 ENCOUNTER — Ambulatory Visit

## 2023-07-06 VITALS — BP 119/79 | HR 88 | Ht 63.0 in | Wt 195.0 lb

## 2023-07-06 DIAGNOSIS — Z0130 Encounter for examination of blood pressure without abnormal findings: Secondary | ICD-10-CM

## 2023-07-06 DIAGNOSIS — O133 Gestational [pregnancy-induced] hypertension without significant proteinuria, third trimester: Secondary | ICD-10-CM

## 2023-07-06 NOTE — Progress Notes (Signed)
 Patient here for BP check.  Denies headaches, blurred vision, or dizziness.  Wanda Erickson [employer]

## 2023-07-07 ENCOUNTER — Encounter: Admitting: Obstetrics & Gynecology

## 2023-07-07 ENCOUNTER — Encounter: Payer: Self-pay | Admitting: Lactation Services

## 2023-07-12 ENCOUNTER — Encounter: Payer: Self-pay | Admitting: Advanced Practice Midwife

## 2023-07-15 ENCOUNTER — Inpatient Hospital Stay (HOSPITAL_COMMUNITY): Admit: 2023-07-15

## 2023-07-30 ENCOUNTER — Ambulatory Visit: Payer: Medicaid Other | Admitting: Clinical

## 2023-07-30 DIAGNOSIS — Z91199 Patient's noncompliance with other medical treatment and regimen due to unspecified reason: Secondary | ICD-10-CM

## 2023-07-30 NOTE — BH Specialist Note (Signed)
 Pt did not arrive to video visit and did not answer the phone; Left HIPPA-compliant message to call back Carolyn Cisco from [employer] for [employer] at [facility] at  [phone] [facility] office).  ?; left MyChart message for patient.  ? ?

## 2023-08-18 ENCOUNTER — Ambulatory Visit: Admitting: Obstetrics and Gynecology

## 2024-01-10 ENCOUNTER — Other Ambulatory Visit: Payer: Self-pay

## 2024-01-21 ENCOUNTER — Ambulatory Visit: Admitting: Physician Assistant
